# Patient Record
Sex: Male | Born: 1935 | Race: White | Hispanic: No | Marital: Married | Smoking: Former smoker
Health system: Southern US, Community
[De-identification: ages and names within clinical notes are randomized; demographics above are authoritative.]

## PROBLEM LIST (undated history)

## (undated) DIAGNOSIS — M79602 Pain in left arm: Secondary | ICD-10-CM

## (undated) DIAGNOSIS — K449 Diaphragmatic hernia without obstruction or gangrene: Secondary | ICD-10-CM

## (undated) DIAGNOSIS — E079 Disorder of thyroid, unspecified: Secondary | ICD-10-CM

## (undated) DIAGNOSIS — N400 Enlarged prostate without lower urinary tract symptoms: Secondary | ICD-10-CM

## (undated) DIAGNOSIS — I10 Essential (primary) hypertension: Secondary | ICD-10-CM

## (undated) DIAGNOSIS — E039 Hypothyroidism, unspecified: Secondary | ICD-10-CM

## (undated) DIAGNOSIS — K225 Diverticulum of esophagus, acquired: Secondary | ICD-10-CM

## (undated) DIAGNOSIS — E119 Type 2 diabetes mellitus without complications: Secondary | ICD-10-CM

## (undated) DIAGNOSIS — N200 Calculus of kidney: Secondary | ICD-10-CM

## (undated) HISTORY — DX: Benign prostatic hyperplasia without lower urinary tract symptoms: N40.0

## (undated) HISTORY — PX: PROSTATE SURGERY: SHX751

## (undated) HISTORY — PX: OTHER SURGICAL HISTORY: SHX169

## (undated) HISTORY — PX: THROAT SURGERY: SHX803

## (undated) HISTORY — DX: Diaphragmatic hernia without obstruction or gangrene: K44.9

## (undated) HISTORY — DX: Diverticulum of esophagus, acquired: K22.5

## (undated) HISTORY — PX: RETINAL DETACHMENT SURGERY: SHX105

## (undated) HISTORY — DX: Pain in left arm: M79.602

## (undated) HISTORY — DX: Calculus of kidney: N20.0

---

## 1997-12-24 ENCOUNTER — Ambulatory Visit: Admission: RE | Admit: 1997-12-24 | Discharge: 1997-12-24 | Payer: Self-pay | Admitting: Internal Medicine

## 2000-05-18 ENCOUNTER — Encounter: Payer: Self-pay | Admitting: Internal Medicine

## 2000-05-18 ENCOUNTER — Encounter: Admission: RE | Admit: 2000-05-18 | Discharge: 2000-05-18 | Payer: Self-pay | Admitting: Internal Medicine

## 2004-05-27 ENCOUNTER — Ambulatory Visit (HOSPITAL_COMMUNITY): Admission: RE | Admit: 2004-05-27 | Discharge: 2004-05-27 | Payer: Self-pay | Admitting: Internal Medicine

## 2007-07-27 ENCOUNTER — Encounter: Admission: RE | Admit: 2007-07-27 | Discharge: 2007-07-27 | Payer: Self-pay | Admitting: Internal Medicine

## 2008-01-29 ENCOUNTER — Encounter: Admission: RE | Admit: 2008-01-29 | Discharge: 2008-01-29 | Payer: Self-pay | Admitting: Internal Medicine

## 2010-06-07 ENCOUNTER — Encounter: Payer: Self-pay | Admitting: Internal Medicine

## 2010-06-07 ENCOUNTER — Encounter: Payer: Self-pay | Admitting: Endocrinology

## 2011-09-22 DIAGNOSIS — I10 Essential (primary) hypertension: Secondary | ICD-10-CM | POA: Diagnosis not present

## 2011-09-22 DIAGNOSIS — E7800 Pure hypercholesterolemia, unspecified: Secondary | ICD-10-CM | POA: Diagnosis not present

## 2011-09-22 DIAGNOSIS — E039 Hypothyroidism, unspecified: Secondary | ICD-10-CM | POA: Diagnosis not present

## 2011-09-22 DIAGNOSIS — E119 Type 2 diabetes mellitus without complications: Secondary | ICD-10-CM | POA: Diagnosis not present

## 2011-09-27 DIAGNOSIS — K449 Diaphragmatic hernia without obstruction or gangrene: Secondary | ICD-10-CM | POA: Diagnosis not present

## 2011-09-27 DIAGNOSIS — R634 Abnormal weight loss: Secondary | ICD-10-CM | POA: Diagnosis not present

## 2011-10-25 DIAGNOSIS — I10 Essential (primary) hypertension: Secondary | ICD-10-CM | POA: Diagnosis not present

## 2011-10-25 DIAGNOSIS — E039 Hypothyroidism, unspecified: Secondary | ICD-10-CM | POA: Diagnosis not present

## 2011-10-25 DIAGNOSIS — E7800 Pure hypercholesterolemia, unspecified: Secondary | ICD-10-CM | POA: Diagnosis not present

## 2011-10-25 DIAGNOSIS — R7309 Other abnormal glucose: Secondary | ICD-10-CM | POA: Diagnosis not present

## 2012-03-20 DIAGNOSIS — D485 Neoplasm of uncertain behavior of skin: Secondary | ICD-10-CM | POA: Diagnosis not present

## 2012-03-20 DIAGNOSIS — C44529 Squamous cell carcinoma of skin of other part of trunk: Secondary | ICD-10-CM | POA: Diagnosis not present

## 2012-03-20 DIAGNOSIS — L821 Other seborrheic keratosis: Secondary | ICD-10-CM | POA: Diagnosis not present

## 2012-03-20 DIAGNOSIS — L570 Actinic keratosis: Secondary | ICD-10-CM | POA: Diagnosis not present

## 2012-03-20 DIAGNOSIS — D239 Other benign neoplasm of skin, unspecified: Secondary | ICD-10-CM | POA: Diagnosis not present

## 2012-03-20 DIAGNOSIS — C44111 Basal cell carcinoma of skin of unspecified eyelid, including canthus: Secondary | ICD-10-CM | POA: Diagnosis not present

## 2012-04-01 DIAGNOSIS — Z23 Encounter for immunization: Secondary | ICD-10-CM | POA: Diagnosis not present

## 2012-04-25 DIAGNOSIS — I10 Essential (primary) hypertension: Secondary | ICD-10-CM | POA: Diagnosis not present

## 2012-04-25 DIAGNOSIS — E039 Hypothyroidism, unspecified: Secondary | ICD-10-CM | POA: Diagnosis not present

## 2012-04-25 DIAGNOSIS — R7309 Other abnormal glucose: Secondary | ICD-10-CM | POA: Diagnosis not present

## 2012-05-01 DIAGNOSIS — I10 Essential (primary) hypertension: Secondary | ICD-10-CM | POA: Diagnosis not present

## 2012-05-01 DIAGNOSIS — R7309 Other abnormal glucose: Secondary | ICD-10-CM | POA: Diagnosis not present

## 2012-05-01 DIAGNOSIS — Z23 Encounter for immunization: Secondary | ICD-10-CM | POA: Diagnosis not present

## 2012-05-01 DIAGNOSIS — E039 Hypothyroidism, unspecified: Secondary | ICD-10-CM | POA: Diagnosis not present

## 2012-05-01 DIAGNOSIS — E7800 Pure hypercholesterolemia, unspecified: Secondary | ICD-10-CM | POA: Diagnosis not present

## 2012-05-29 DIAGNOSIS — L570 Actinic keratosis: Secondary | ICD-10-CM | POA: Diagnosis not present

## 2012-05-29 DIAGNOSIS — E039 Hypothyroidism, unspecified: Secondary | ICD-10-CM | POA: Diagnosis not present

## 2012-05-29 DIAGNOSIS — I10 Essential (primary) hypertension: Secondary | ICD-10-CM | POA: Diagnosis not present

## 2012-10-25 DIAGNOSIS — E039 Hypothyroidism, unspecified: Secondary | ICD-10-CM | POA: Diagnosis not present

## 2012-10-25 DIAGNOSIS — I10 Essential (primary) hypertension: Secondary | ICD-10-CM | POA: Diagnosis not present

## 2012-10-25 DIAGNOSIS — R7309 Other abnormal glucose: Secondary | ICD-10-CM | POA: Diagnosis not present

## 2012-10-30 DIAGNOSIS — E039 Hypothyroidism, unspecified: Secondary | ICD-10-CM | POA: Diagnosis not present

## 2012-10-30 DIAGNOSIS — R351 Nocturia: Secondary | ICD-10-CM | POA: Diagnosis not present

## 2012-10-30 DIAGNOSIS — I10 Essential (primary) hypertension: Secondary | ICD-10-CM | POA: Diagnosis not present

## 2012-10-30 DIAGNOSIS — R7309 Other abnormal glucose: Secondary | ICD-10-CM | POA: Diagnosis not present

## 2013-01-24 DIAGNOSIS — R351 Nocturia: Secondary | ICD-10-CM | POA: Diagnosis not present

## 2013-01-29 DIAGNOSIS — R7309 Other abnormal glucose: Secondary | ICD-10-CM | POA: Diagnosis not present

## 2013-01-29 DIAGNOSIS — N401 Enlarged prostate with lower urinary tract symptoms: Secondary | ICD-10-CM | POA: Diagnosis not present

## 2013-01-29 DIAGNOSIS — I10 Essential (primary) hypertension: Secondary | ICD-10-CM | POA: Diagnosis not present

## 2013-01-29 DIAGNOSIS — E039 Hypothyroidism, unspecified: Secondary | ICD-10-CM | POA: Diagnosis not present

## 2013-02-16 DIAGNOSIS — R7309 Other abnormal glucose: Secondary | ICD-10-CM | POA: Diagnosis not present

## 2013-04-23 ENCOUNTER — Encounter (HOSPITAL_COMMUNITY): Payer: Self-pay | Admitting: Emergency Medicine

## 2013-04-23 ENCOUNTER — Observation Stay (HOSPITAL_COMMUNITY)
Admission: EM | Admit: 2013-04-23 | Discharge: 2013-04-23 | Disposition: A | Discharge status: Home / Self Care | Payer: Medicare Other | Attending: Internal Medicine | Admitting: Internal Medicine

## 2013-04-23 ENCOUNTER — Emergency Department (HOSPITAL_COMMUNITY): Payer: Medicare Other

## 2013-04-23 ENCOUNTER — Inpatient Hospital Stay (HOSPITAL_COMMUNITY): Payer: Medicare Other

## 2013-04-23 DIAGNOSIS — E119 Type 2 diabetes mellitus without complications: Secondary | ICD-10-CM | POA: Diagnosis not present

## 2013-04-23 DIAGNOSIS — E039 Hypothyroidism, unspecified: Secondary | ICD-10-CM | POA: Diagnosis not present

## 2013-04-23 DIAGNOSIS — Z79899 Other long term (current) drug therapy: Secondary | ICD-10-CM | POA: Insufficient documentation

## 2013-04-23 DIAGNOSIS — E079 Disorder of thyroid, unspecified: Secondary | ICD-10-CM | POA: Insufficient documentation

## 2013-04-23 DIAGNOSIS — R4789 Other speech disturbances: Secondary | ICD-10-CM | POA: Insufficient documentation

## 2013-04-23 DIAGNOSIS — G319 Degenerative disease of nervous system, unspecified: Secondary | ICD-10-CM | POA: Diagnosis not present

## 2013-04-23 DIAGNOSIS — Z8673 Personal history of transient ischemic attack (TIA), and cerebral infarction without residual deficits: Secondary | ICD-10-CM | POA: Diagnosis not present

## 2013-04-23 DIAGNOSIS — I10 Essential (primary) hypertension: Secondary | ICD-10-CM | POA: Diagnosis present

## 2013-04-23 DIAGNOSIS — G459 Transient cerebral ischemic attack, unspecified: Secondary | ICD-10-CM | POA: Diagnosis present

## 2013-04-23 DIAGNOSIS — I079 Rheumatic tricuspid valve disease, unspecified: Secondary | ICD-10-CM | POA: Diagnosis not present

## 2013-04-23 DIAGNOSIS — I359 Nonrheumatic aortic valve disorder, unspecified: Secondary | ICD-10-CM | POA: Insufficient documentation

## 2013-04-23 DIAGNOSIS — I059 Rheumatic mitral valve disease, unspecified: Secondary | ICD-10-CM | POA: Diagnosis not present

## 2013-04-23 DIAGNOSIS — I6789 Other cerebrovascular disease: Secondary | ICD-10-CM | POA: Diagnosis not present

## 2013-04-23 DIAGNOSIS — Z9889 Other specified postprocedural states: Secondary | ICD-10-CM | POA: Diagnosis not present

## 2013-04-23 DIAGNOSIS — R4701 Aphasia: Secondary | ICD-10-CM | POA: Diagnosis not present

## 2013-04-23 DIAGNOSIS — E785 Hyperlipidemia, unspecified: Secondary | ICD-10-CM | POA: Diagnosis not present

## 2013-04-23 DIAGNOSIS — I6529 Occlusion and stenosis of unspecified carotid artery: Secondary | ICD-10-CM | POA: Insufficient documentation

## 2013-04-23 DIAGNOSIS — I6350 Cerebral infarction due to unspecified occlusion or stenosis of unspecified cerebral artery: Secondary | ICD-10-CM | POA: Diagnosis not present

## 2013-04-23 HISTORY — DX: Hypothyroidism, unspecified: E03.9

## 2013-04-23 HISTORY — DX: Disorder of thyroid, unspecified: E07.9

## 2013-04-23 HISTORY — DX: Essential (primary) hypertension: I10

## 2013-04-23 HISTORY — DX: Type 2 diabetes mellitus without complications: E11.9

## 2013-04-23 LAB — CBC
HCT: 39.5 % (ref 39.0–52.0)
Hemoglobin: 13.6 g/dL (ref 13.0–17.0)
MCH: 31.8 pg (ref 26.0–34.0)
MCHC: 34.4 g/dL (ref 30.0–36.0)
MCV: 92.3 fL (ref 78.0–100.0)

## 2013-04-23 LAB — URINALYSIS, ROUTINE W REFLEX MICROSCOPIC
Glucose, UA: NEGATIVE mg/dL
Hgb urine dipstick: NEGATIVE
Leukocytes, UA: NEGATIVE
Nitrite: NEGATIVE
Protein, ur: NEGATIVE mg/dL

## 2013-04-23 LAB — COMPREHENSIVE METABOLIC PANEL
ALT: 12 U/L (ref 0–53)
AST: 14 U/L (ref 0–37)
Albumin: 3.4 g/dL — ABNORMAL LOW (ref 3.5–5.2)
BUN: 18 mg/dL (ref 6–23)
CO2: 28 mEq/L (ref 19–32)
Calcium: 9.2 mg/dL (ref 8.4–10.5)
Sodium: 136 mEq/L (ref 135–145)
Total Bilirubin: 0.2 mg/dL — ABNORMAL LOW (ref 0.3–1.2)
Total Protein: 6.5 g/dL (ref 6.0–8.3)

## 2013-04-23 LAB — LIPID PANEL
Cholesterol: 204 mg/dL — ABNORMAL HIGH (ref 0–200)
Total CHOL/HDL Ratio: 2.7 RATIO
Triglycerides: 49 mg/dL (ref <150)

## 2013-04-23 LAB — POCT I-STAT TROPONIN I

## 2013-04-23 LAB — HEMOGLOBIN A1C: Hgb A1c MFr Bld: 6.3 % — ABNORMAL HIGH (ref <5.7)

## 2013-04-23 LAB — GLUCOSE, CAPILLARY
Glucose-Capillary: 111 mg/dL — ABNORMAL HIGH (ref 70–99)
Glucose-Capillary: 101 mg/dL — ABNORMAL HIGH (ref 70–99)

## 2013-04-23 LAB — PROTIME-INR
INR: 0.89 (ref 0.00–1.49)
Prothrombin Time: 11.9 seconds (ref 11.6–15.2)

## 2013-04-23 LAB — TSH: TSH: 7.559 u[IU]/mL — ABNORMAL HIGH (ref 0.350–4.500)

## 2013-04-23 MED ORDER — AMLODIPINE BESYLATE 5 MG PO TABS
5.0000 mg | ORAL_TABLET | Freq: Every day | ORAL | Status: DC
  Filled 2013-04-23: qty 1

## 2013-04-23 MED ORDER — TERAZOSIN HCL 2 MG PO CAPS
2.0000 mg | ORAL_CAPSULE | Freq: Every evening | ORAL | Status: DC
  Filled 2013-04-23: qty 1

## 2013-04-23 MED ORDER — ENOXAPARIN SODIUM 40 MG/0.4ML ~~LOC~~ SOLN
40.0000 mg | SUBCUTANEOUS | Status: DC
  Administered 2013-04-23: 40 mg via SUBCUTANEOUS
  Filled 2013-04-23 (×2): qty 0.4

## 2013-04-23 MED ORDER — ASPIRIN 325 MG PO TABS
325.0000 mg | ORAL_TABLET | Freq: Once | ORAL | Status: AC
  Administered 2013-04-23: 325 mg via ORAL
  Filled 2013-04-23: qty 1

## 2013-04-23 MED ORDER — ASPIRIN 325 MG PO TABS
325.0000 mg | ORAL_TABLET | Freq: Every day | ORAL | Status: DC
  Filled 2013-04-23: qty 1

## 2013-04-23 MED ORDER — AMLODIPINE BESYLATE 5 MG PO TABS: 5.0000 mg | ORAL_TABLET | Freq: Every day | ORAL | Status: DC

## 2013-04-23 MED ORDER — HYDRALAZINE HCL 20 MG/ML IJ SOLN: 10.0000 mg | INTRAMUSCULAR | Status: DC | PRN

## 2013-04-23 MED ORDER — SODIUM CHLORIDE 0.9 % IV SOLN: INTRAVENOUS | Status: DC

## 2013-04-23 MED ORDER — VITAMIN B-12 1000 MCG PO TABS
1000.0000 ug | ORAL_TABLET | Freq: Every evening | ORAL | Status: DC
Start: 2013-04-23 — End: 2013-04-23
  Filled 2013-04-23: qty 1

## 2013-04-23 MED ORDER — HYDRALAZINE HCL 20 MG/ML IJ SOLN: 10.0000 mg | Freq: Four times a day (QID) | INTRAMUSCULAR | Status: DC | PRN

## 2013-04-23 MED ORDER — INSULIN ASPART 100 UNIT/ML ~~LOC~~ SOLN: 0.0000 [IU] | Freq: Three times a day (TID) | SUBCUTANEOUS | Status: DC

## 2013-04-23 MED ORDER — LEVOTHYROXINE SODIUM 25 MCG PO TABS
25.0000 ug | ORAL_TABLET | Freq: Every evening | ORAL | Status: DC
Start: 2013-04-23 — End: 2013-04-23
  Filled 2013-04-23: qty 1

## 2013-04-23 MED ORDER — ATORVASTATIN CALCIUM 20 MG PO TABS: 20.0000 mg | ORAL_TABLET | Freq: Every day | ORAL | Status: DC

## 2013-04-23 MED ORDER — LISINOPRIL 20 MG PO TABS
20.0000 mg | ORAL_TABLET | Freq: Every day | ORAL | Status: DC
  Administered 2013-04-23: 20 mg via ORAL
  Filled 2013-04-23: qty 1

## 2013-04-23 MED ORDER — ATORVASTATIN CALCIUM 20 MG PO TABS
20.0000 mg | ORAL_TABLET | Freq: Every day | ORAL | Status: DC
Start: 2013-04-23 — End: 2013-04-23
  Filled 2013-04-23: qty 1

## 2013-04-23 MED ORDER — ASPIRIN EC 81 MG PO TBEC: 81.0000 mg | DELAYED_RELEASE_TABLET | Freq: Every day | ORAL | Status: AC

## 2013-04-23 NOTE — Discharge Summary (Signed)
Physician Discharge Summary  Patient ID: VOLLIE BRUNTY MRN: [id number] DOB/AGE: [birth year] 79 y.o.  Admit date: 04/23/2013 Discharge date: 04/23/2013  Primary Care Physician:  [name], MD  Discharge Diagnoses:    . TIA (transient ischemic attack) . HTN (hypertension)- uncontrolled  . Hypothyroidism . Diabetes  hyperlipidemia   Consults: Neurology, Dr. Cyril Mourning via phone consultation  Recommendations for Outpatient Follow-up:  1. please check CK and LFTs in 4 weeks, patient started on Lipitor 2. patient was also started on Norvasc 5 mg daily, please followup in 10 days, patient may need to titrate BP medications  Allergies:  No Known Allergies   Discharge Medications:   Medication List         amLODipine 5 MG tablet  Commonly known as:  NORVASC  Take 1 tablet (5 mg total) by mouth daily.     aspirin EC 81 MG tablet  Take 1 tablet (81 mg total) by mouth daily.     atorvastatin 20 MG tablet  Commonly known as:  LIPITOR  Take 1 tablet (20 mg total) by mouth at bedtime.     CINNAMON PO  Take 1,000 mg by mouth every evening.     levothyroxine 25 MCG tablet  Commonly known as:  SYNTHROID, LEVOTHROID  Take 25 mcg by mouth every evening.     lisinopril-hydrochlorothiazide 20-12.5 MG per tablet  Commonly known as:  PRINZIDE,ZESTORETIC  Take 1 tablet by mouth every evening.     metFORMIN 500 MG tablet  Commonly known as:  GLUCOPHAGE  Take 250 mg by mouth every evening.     terazosin 2 MG capsule  Commonly known as:  HYTRIN  Take 2 mg by mouth every evening.     vitamin B-12 1000 MCG tablet  Commonly known as:  CYANOCOBALAMIN  Take 1,000 mcg by mouth every evening.         Brief H and P: For complete details please refer to admission H and P, but in brief ALLYN BERTONI is a 79 y.o. male with history of hypertension, hypothyroidism, prediabetes started experiencing slurred speech a day before around 2:30 PM when he was not able to bring words out of his  mouth which lasted for around 2 minutes. Patient did not have any associated difficulty swallowing or any blurred vision. Patient did not lose strength of his upper or lower extremities. Since he was worried he came in the middle of the night with concerns of his symptoms. In the ER patient was nonfocal on exam. CT head did not show anything acute. Patient was admitted for further workup for TIA. Patient denies any chest pain shortness of breath nausea vomiting abdominal pain fever chills diarrhea.    Hospital Course:  79 year old male with history of hypertension, diabetes, hypothyroidism presented with transient aphasia  TIA: Symptoms were completely resolved at the time of admission. Patient underwent full stroke workup. MRI of the brain showed no evidence of acute infarction, hemorrhage, mass lesion, hydrocephalus. MRA showed mild to moderate atrophy and chronic microvascular ischemic changes, no acute intracranial findings. 2-D echo showed EF of 55-60%, grade 1 diastolic dysfunction Carotid Dopplers showed 1-39% ICA stenosis. Patient was not on any aspirin prior to admission, he was initially placed on aspirin 325mg  daily. I discussed in detail with Dr. Cyril Mourning for neurology consult, since MRI was negative for acute stroke, Dr. Cyril Mourning recommended aspirin 81 mg daily. Lipid panel showed cholesterol 214 LDL of under 19, patient was started on Lipitor 20 mg daily Hemoglobin A1c  is 6.3. Patient is on metformin 250 mg daily, he will discuss with Dr. Selena Batten if the dose needs to be increased. Patient is currently at his baseline. He is ambulating without any assistance, eating without any difficulty.  Patient will be discharged in good condition.  Day of Discharge BP 176/83  Pulse 54  Temp(Src) 97.7 F (36.5 C) (Oral)  Resp 18  Ht 5\' 6"  (1.676 m)  Wt 62.8 kg (138 lb 7.2 oz)  BMI 22.36 kg/m2  SpO2 95%  Physical Exam: General: Alert and awake oriented x3 not in any acute distress. HEENT:  anicteric sclera, pupils reactive to light and accommodation CVS: S1-S2 clear no murmur rubs or gallops Chest: clear to auscultation bilaterally, no wheezing rales or rhonchi Abdomen: soft nontender, nondistended, normal bowel sounds, no organomegaly Extremities: no cyanosis, clubbing or edema noted bilaterally Neuro: Cranial nerves II-XII intact, no focal neurological deficits   The results of significant diagnostics from this hospitalization (including imaging, microbiology, ancillary and laboratory) are listed below for reference.    LAB RESULTS: Basic Metabolic Panel:  Recent Labs Lab 04/23/13 0445  NA 136  K 3.9  CL 101  CO2 28  GLUCOSE 124*  BUN 18  CREATININE 1.06  CALCIUM 9.2   Liver Function Tests:  Recent Labs Lab 04/23/13 0445  AST 14  ALT 12  ALKPHOS 80  BILITOT 0.2*  PROT 6.5  ALBUMIN 3.4*   No results found for this basename: LIPASE, AMYLASE,  in the last 168 hours No results found for this basename: AMMONIA,  in the last 168 hours CBC:  Recent Labs Lab 04/23/13 0445  WBC 6.5  HGB 13.6  HCT 39.5  MCV 92.3  PLT 241   Cardiac Enzymes: No results found for this basename: CKTOTAL, CKMB, CKMBINDEX, TROPONINI,  in the last 168 hours BNP: No components found with this basename: POCBNP,  CBG:  Recent Labs Lab 04/23/13 0817 04/23/13 1225  GLUCAP 111* 101*    Significant Diagnostic Studies:  Ct Head Wo Contrast  04/23/2013   CLINICAL DATA:  Transient difficulty speaking.  EXAM: CT HEAD WITHOUT CONTRAST  TECHNIQUE: Contiguous axial images were obtained from the base of the skull through the vertex without intravenous contrast.  COMPARISON:  None.  FINDINGS: There is no evidence of acute infarction, mass lesion, or intra- or extra-axial hemorrhage on CT.  Prominence of the ventricles and sulci reflects mild to moderate cortical volume loss. Cerebellar atrophy is noted. Scattered periventricular and subcortical white matter change likely reflects  small vessel ischemic microangiopathy. Scattered small chronic lacunar infarcts are seen within the left basal ganglia.  The brainstem and fourth ventricle are within normal limits. The cerebral hemispheres demonstrate grossly normal gray-white differentiation. No mass effect or midline shift is seen.  There is no evidence of fracture; visualized osseous structures are unremarkable in appearance. The patient is status post right-sided scleral banding. The orbits are otherwise unremarkable. The paranasal sinuses and mastoid air cells are well-aerated. No significant soft tissue abnormalities are seen.  IMPRESSION: 1. No acute intracranial pathology seen on CT. 2. Mild to moderate cortical volume loss and scattered small vessel ischemic microangiopathy. 3. Scattered small chronic lacunar infarcts within the left basal ganglia.   Electronically Signed   By: Roanna Raider M.D.   On: 04/23/2013 05:41   Mri Brain Without Contrast  04/23/2013   CLINICAL DATA:  Slurred speech. Stroke risk factors include hypertension and diabetes. Symptoms have now resolved.  EXAM: MRI HEAD WITHOUT CONTRAST  MRA  HEAD WITHOUT CONTRAST  TECHNIQUE: Multiplanar, multiecho pulse sequences of the brain and surrounding structures were obtained without intravenous contrast. Angiographic images of the head were obtained using MRA technique without contrast.  COMPARISON:  CT head 04/23/2013.  FINDINGS: MRI HEAD FINDINGS  No evidence for acute infarction, hemorrhage, mass lesion, hydrocephalus, or extra-axial fluid. Mild to moderate cerebral and cerebellar atrophy. Mild to moderate subcortical and periventricular T2 and FLAIR hyperintensities, likely chronic microvascular ischemic change. Flow voids are maintained throughout the carotid, basilar, and vertebral arteries. There are no areas of chronic hemorrhage. Pituitary, pineal, and cerebellar tonsils unremarkable.  Moderate pannus surrounds the odontoid and projects into the ventral subarachnoid  space. The upper cervical cord is minimally displaced posteriorly without significant compression. Visualized calvarium, skull base, and upper cervical osseous structures otherwise unremarkable.  Scalp and extracranial soft tissues, orbits, sinuses, and mastoids show no acute process. Previous cataract extraction. Compared with prior CT the appearance is similar.  MRA HEAD FINDINGS  The internal carotid arteries are widely patent. The basilar artery is widely patent with vertebrals codominant. There is no proximal intracranial stenosis or aneurysm. There is slight irregularity of the distal MCA and PCA branches suggesting intracranial atherosclerotic change.  IMPRESSION: Mild to moderate atrophy and chronic microvascular ischemic change. No acute intracranial findings.  Moderate pannus surrounds the odontoid. Correlate clinically for erosive arthropathy elsewhere. See discussion above.  No proximal flow reducing stenosis or occlusion. Mild irregularity of distal MCA and PCA branches suggests intracranial atherosclerotic change.   Electronically Signed   By: Davonna Belling M.D.   On: 04/23/2013 14:11   Mr Maxine Glenn Head/brain Wo Cm  04/23/2013   CLINICAL DATA:  Slurred speech. Stroke risk factors include hypertension and diabetes. Symptoms have now resolved.  EXAM: MRI HEAD WITHOUT CONTRAST  MRA HEAD WITHOUT CONTRAST  TECHNIQUE: Multiplanar, multiecho pulse sequences of the brain and surrounding structures were obtained without intravenous contrast. Angiographic images of the head were obtained using MRA technique without contrast.  COMPARISON:  CT head 04/23/2013.  FINDINGS: MRI HEAD FINDINGS  No evidence for acute infarction, hemorrhage, mass lesion, hydrocephalus, or extra-axial fluid. Mild to moderate cerebral and cerebellar atrophy. Mild to moderate subcortical and periventricular T2 and FLAIR hyperintensities, likely chronic microvascular ischemic change. Flow voids are maintained throughout the carotid, basilar,  and vertebral arteries. There are no areas of chronic hemorrhage. Pituitary, pineal, and cerebellar tonsils unremarkable.  Moderate pannus surrounds the odontoid and projects into the ventral subarachnoid space. The upper cervical cord is minimally displaced posteriorly without significant compression. Visualized calvarium, skull base, and upper cervical osseous structures otherwise unremarkable.  Scalp and extracranial soft tissues, orbits, sinuses, and mastoids show no acute process. Previous cataract extraction. Compared with prior CT the appearance is similar.  MRA HEAD FINDINGS  The internal carotid arteries are widely patent. The basilar artery is widely patent with vertebrals codominant. There is no proximal intracranial stenosis or aneurysm. There is slight irregularity of the distal MCA and PCA branches suggesting intracranial atherosclerotic change.  IMPRESSION: Mild to moderate atrophy and chronic microvascular ischemic change. No acute intracranial findings.  Moderate pannus surrounds the odontoid. Correlate clinically for erosive arthropathy elsewhere. See discussion above.  No proximal flow reducing stenosis or occlusion. Mild irregularity of distal MCA and PCA branches suggests intracranial atherosclerotic change.   Electronically Signed   By: Davonna Belling M.D.   On: 04/23/2013 14:11    2D ECHO:  Study Conclusions  - Left ventricle: The cavity size was normal. Wall thickness  was normal. Systolic function was normal. The estimated ejection fraction was in the range of 55% to 60%. Wall motion was normal; there were no regional wall motion abnormalities. Doppler parameters are consistent with abnormal left ventricular relaxation (grade 1 diastolic dysfunction). - Aortic valve: There was no stenosis. Trivial regurgitation. - Aorta: Dilated aortic root and ascending aorta. Aortic root dimension: 41mm (ED), ascending aorta dimension: 44 mm. - Mitral valve: Mildly calcified annulus. Normal  thickness leaflets . Trivial regurgitation. - Left atrium: The atrium was mildly dilated. - Right ventricle: The cavity size was normal. Systolic function was normal. - Right atrium: The atrium was mildly dilated. - Pulmonary arteries: PA systolic pressure 31-35 mmHg. - Systemic veins: IVC measured 2.0 cm with normal respirophasic variation, suggesting RA pressure 6-10 mmHg.   Disposition and Follow-up:     Discharge Orders   Future Orders Complete By Expires   Diet - low sodium heart healthy  As directed    Increase activity slowly  As directed        DISPOSITION: Home  DIET:Heart healthy diet  ACTIVITY: As tolerated    DISCHARGE FOLLOW-UP Follow-up Information   Follow up with [name], MD. Schedule an appointment as soon as possible for a visit in 10 days. (for hospital follow-up)    Specialty:  Internal Medicine   Contact information:   [address] [place] [address] [phone]       Follow up with Gates Rigg, MD. Schedule an appointment as soon as possible for a visit in 2 months. (for TIA follow-up)    Specialties:  Neurology, Radiology   Contact information:   [address] [place] [address] [phone]       Time spent on Discharge: 35 minutes  Signed:   RAI,RIPUDEEP M.D. [facility] 04/23/2013, 2:56 PM Pager: [phone]

## 2013-04-23 NOTE — Progress Notes (Signed)
VASCULAR LAB PRELIMINARY  PRELIMINARY  PRELIMINARY  PRELIMINARY  Carotid duplex completed.    Preliminary report:  Bilateral:  1-39% ICA stenosis.  Vertebral artery flow is antegrade.     Jozelyn Kuwahara, RVS 04/23/2013, 12:53 PM

## 2013-04-23 NOTE — Progress Notes (Signed)
patient seen and examined  Review 79 year old male with history of hypertension, diabetes, hypothyroidism presented with transient aphasia - symptoms resolved, examination unremarkable  A/p - TIA workup in progress - MRI/MRA still pending - 2-D echo done, results pending - Carotid Dopplers 1-39% ICA stenosis - Continue aspirin. Discussed with Dr. Cyril Mourning recommended aspirin 81 mg daily if no infarct on the MRI, otherwise aspirin 325 mg daily. Patient was not on aspirin prior to the admission - Lipid panel pending - Continue BP control   Nylia Gavina M.D. [facility] 04/23/2013, 1:27 PM  Pager: [phone]

## 2013-04-23 NOTE — ED Notes (Signed)
Patient states at approx 1500 yesterday he began having trouble speaking which last approx 5 min. With symptoms then resolving.  Patient states no symptoms since that time, patient a/o x 4 at this time and asymptomatic

## 2013-04-23 NOTE — Progress Notes (Signed)
Paged Dr. Isidoro Donning concerning systolic >180.    Lance Bosch, RN

## 2013-04-23 NOTE — H&P (Signed)
[facility] History and Physical  Austin Swanson HYQ:[id number] DOB: 1917-06-27 DOA: 04/23/2013  Referring physician: ER physician. PCP: No primary provider on file.  Chief Complaint: Slurred speech.  HPI: Austin Swanson is a 79 y.o. male with history of hypertension, hypothyroidism, prediabetes started experiencing slurred speech yesterday around 2:30 PM when he was not able to bring words out of his mouth which lasted for around 2 minutes. Patient did not have any associated difficulty swallowing or any blurred vision. Patient did not lose strengths of his upper or lower extremities. Since he was worried he came in the middle of the night with concerns of his symptoms. In the ER patient was nonfocal on exam. CT head did not show anything acute. Patient has been admitted for further workup for TIA. Patient denies any chest pain shortness of breath nausea vomiting abdominal pain fever chills diarrhea. Patient states 2 years ago he had surgery for his throat for diverticulum.   Review of Systems: As presented in the history of presenting illness, rest negative.  Past Medical History  Diagnosis Date  . Hypertension   . Diabetes mellitus without complication   . Thyroid disease    Past Surgical History  Procedure Laterality Date  . Prostate surgery    . Throat surgery     Social History:  reports that he has never smoked. He does not have any smokeless tobacco history on file. He reports that he does not drink alcohol or use illicit drugs. Where does patient live home. Can patient participate in ADLs? Yes.  No Known Allergies  Family History:  Family History  Problem Relation Age of Onset  . CAD Mother       Prior to Admission medications   Medication Sig Start Date End Date Taking? Authorizing Provider  CINNAMON PO Take 1,000 mg by mouth every evening.   Yes Historical Provider, MD  levothyroxine (SYNTHROID, LEVOTHROID) 25 MCG tablet Take 25 mcg by mouth every evening.    Yes Historical Provider, MD  lisinopril-hydrochlorothiazide (PRINZIDE,ZESTORETIC) 20-12.5 MG per tablet Take 1 tablet by mouth every evening.   Yes Historical Provider, MD  metFORMIN (GLUCOPHAGE) 500 MG tablet Take 250 mg by mouth every evening.   Yes Historical Provider, MD  terazosin (HYTRIN) 2 MG capsule Take 2 mg by mouth every evening.   Yes Historical Provider, MD  vitamin B-12 (CYANOCOBALAMIN) 1000 MCG tablet Take 1,000 mcg by mouth every evening.   Yes Historical Provider, MD    Physical Exam: Filed Vitals:   04/23/13 0502 04/23/13 0503 04/23/13 0504 04/23/13 0600  BP:    178/78  Pulse:      Temp: 98 F (36.7 C) 98 F (36.7 C) 98 F (36.7 C)   TempSrc:      Resp:    18  Weight:      SpO2:    94%     General:  Well-developed and nourished.  Eyes: Anicteric no pallor.  ENT: No discharge from the ears eyes nose mouth.  Neck: No mass felt.  Cardiovascular: S1-S2 heard.  Respiratory: No rhonchi or crepitations.  Abdomen: Soft nontender bowel sounds present. No guarding no rigidity.  Skin: No rash.  Musculoskeletal: No edema.  Psychiatric: Appears normal.  Neurologic: Alert awake oriented to time place and person. Moves all extremities 5 x 5. No facial asymmetry. Tongue is midline.  Labs on Admission:  Basic Metabolic Panel:  Recent Labs Lab 04/23/13 0445  NA 136  K 3.9  CL 101  CO2  28  GLUCOSE 124*  BUN 18  CREATININE 1.06  CALCIUM 9.2   Liver Function Tests:  Recent Labs Lab 04/23/13 0445  AST 14  ALT 12  ALKPHOS 80  BILITOT 0.2*  PROT 6.5  ALBUMIN 3.4*   No results found for this basename: LIPASE, AMYLASE,  in the last 168 hours No results found for this basename: AMMONIA,  in the last 168 hours CBC:  Recent Labs Lab 04/23/13 0445  WBC 6.5  HGB 13.6  HCT 39.5  MCV 92.3  PLT 241   Cardiac Enzymes: No results found for this basename: CKTOTAL, CKMB, CKMBINDEX, TROPONINI,  in the last 168 hours  BNP (last 3 results) No results  found for this basename: PROBNP,  in the last 8760 hours CBG: No results found for this basename: GLUCAP,  in the last 168 hours  Radiological Exams on Admission: Ct Head Wo Contrast  04/23/2013   CLINICAL DATA:  Transient difficulty speaking.  EXAM: CT HEAD WITHOUT CONTRAST  TECHNIQUE: Contiguous axial images were obtained from the base of the skull through the vertex without intravenous contrast.  COMPARISON:  None.  FINDINGS: There is no evidence of acute infarction, mass lesion, or intra- or extra-axial hemorrhage on CT.  Prominence of the ventricles and sulci reflects mild to moderate cortical volume loss. Cerebellar atrophy is noted. Scattered periventricular and subcortical white matter change likely reflects small vessel ischemic microangiopathy. Scattered small chronic lacunar infarcts are seen within the left basal ganglia.  The brainstem and fourth ventricle are within normal limits. The cerebral hemispheres demonstrate grossly normal gray-white differentiation. No mass effect or midline shift is seen.  There is no evidence of fracture; visualized osseous structures are unremarkable in appearance. The patient is status post right-sided scleral banding. The orbits are otherwise unremarkable. The paranasal sinuses and mastoid air cells are well-aerated. No significant soft tissue abnormalities are seen.  IMPRESSION: 1. No acute intracranial pathology seen on CT. 2. Mild to moderate cortical volume loss and scattered small vessel ischemic microangiopathy. 3. Scattered small chronic lacunar infarcts within the left basal ganglia.   Electronically Signed   By: Roanna Raider M.D.   On: 04/23/2013 05:41     Assessment/Plan Principal Problem:   TIA (transient ischemic attack) Active Problems:   HTN (hypertension)   Hypothyroidism   1. TIA - patient's symptoms are concerning for TIA. Patient has presented to the ER more than 12 hours later. Presently patient is asymptomatic. Patient has been  placed on neurochecks swallow evaluation and we will get MRI/MRA brain 2-D echo carotid Doppler. Aspirin. 2. Hypertension - continue lisinopril. We will hold off HCTZ and gently hydrate. When necessary IV hydralazine for systolic blood pressure more than 220. 3. Hypothyroidism - continue Synthroid. Check TSH. 4. History of prediabetes - presently I have placed patient on sliding-scale coverage. Check hemoglobin A1c.    Code Status: Full code.  Family Communication: Patient's wife at the bedside.  Disposition Plan: Admit for observation.    Davionne Mastrangelo N. [facility] Pager [phone].  If 7PM-7AM, please contact night-coverage [email or web address] Password [facility] 04/23/2013, 6:38 AM

## 2013-04-23 NOTE — ED Provider Notes (Signed)
CSN: [id number]     Arrival date & time 04/23/13  0316 History   First MD Initiated Contact with Patient 04/23/13 0413     Chief Complaint  Patient presents with  . Aphasia    resolved   (Consider location/radiation/quality/duration/timing/severity/associated sxs/prior Treatment) HPI 79 year old male presents to emergency room with complaint of brief period of aphasia.  Patient reports yesterday, around 3 PM, while having lunch with his daughter and wife.  He had a five-minute episode of having garbled speech.  Patient reports he knew what he wanted to say, but could not get the words out properly.  Wife the bedside, reports that the worse in garbled, and strange.  Patient reports over the last few weeks to months.  He has had several episodes similar to this.  Patient has history of hypertension, diabetes, and hypothyroidism.  No prior history of TIA or stroke.  Mother had several strokes.  Patient is concern for TIA, and could not sleep tonight hence coming to the emergency department.  Patient's primary care doctor is Austin Swanson. Past Medical History  Diagnosis Date  . Hypertension   . Diabetes mellitus without complication   . Thyroid disease    Past Surgical History  Procedure Laterality Date  . Prostate surgery    . Throat surgery     Family History  Problem Relation Age of Onset  . CAD Mother    History  Substance Use Topics  . Smoking status: Never Smoker   . Smokeless tobacco: Not on file  . Alcohol Use: No    Review of Systems  See History of Present Illness; otherwise all other systems are reviewed and negative Allergies  Review of patient's allergies indicates no known allergies.  Home Medications  No current outpatient prescriptions on file. BP 191/79  Pulse 60  Temp(Src) 97.6 F (36.4 C) (Oral)  Resp 15  Ht 5\' 6"  (1.676 m)  Wt 138 lb 7.2 oz (62.8 kg)  BMI 22.36 kg/m2  SpO2 94% Physical Exam  Nursing note and vitals reviewed. Constitutional: He is  oriented to person, place, and time. He appears well-developed and well-nourished. No distress.  HENT:  Head: Normocephalic and atraumatic.  Nose: Nose normal.  Mouth/Throat: Oropharynx is clear and moist.  Eyes: Conjunctivae and EOM are normal. Pupils are equal, round, and reactive to light.  Neck: Normal range of motion. Neck supple. No JVD present. No tracheal deviation present. No thyromegaly present.  Cardiovascular: Normal rate, regular rhythm, normal heart sounds and intact distal pulses.  Exam reveals no gallop and no friction rub.   No murmur heard. Pulmonary/Chest: Effort normal and breath sounds normal. No stridor. No respiratory distress. He has no wheezes. He has no rales. He exhibits no tenderness.  Abdominal: Soft. Bowel sounds are normal. He exhibits no distension and no mass. There is no tenderness. There is no rebound and no guarding.  Musculoskeletal: Normal range of motion. He exhibits no edema and no tenderness.  Lymphadenopathy:    He has no cervical adenopathy.  Neurological: He is alert and oriented to person, place, and time. He has normal reflexes. No cranial nerve deficit. He exhibits normal muscle tone. Coordination normal.  Skin: Skin is warm and dry. No rash noted. He is not diaphoretic. No erythema. No pallor.  Psychiatric: He has a normal mood and affect. His behavior is normal. Judgment and thought content normal.    ED Course  Procedures (including critical care time) Labs Review Labs Reviewed  COMPREHENSIVE METABOLIC PANEL -  Abnormal; Notable for the following:    Glucose, Bld 124 (*)    Albumin 3.4 (*)    Total Bilirubin 0.2 (*)    GFR calc non Af Amer 58 (*)    GFR calc Af Amer 68 (*)    All other components within normal limits  URINALYSIS, ROUTINE W REFLEX MICROSCOPIC  PROTIME-INR  CBC  HEMOGLOBIN A1C  CBC WITH DIFFERENTIAL  TSH  URINE RAPID DRUG SCREEN (HOSP PERFORMED)  CBC  POCT I-STAT TROPONIN I   Imaging Review Ct Head Wo  Contrast  04/23/2013   CLINICAL DATA:  Transient difficulty speaking.  EXAM: CT HEAD WITHOUT CONTRAST  TECHNIQUE: Contiguous axial images were obtained from the base of the skull through the vertex without intravenous contrast.  COMPARISON:  None.  FINDINGS: There is no evidence of acute infarction, mass lesion, or intra- or extra-axial hemorrhage on CT.  Prominence of the ventricles and sulci reflects mild to moderate cortical volume loss. Cerebellar atrophy is noted. Scattered periventricular and subcortical white matter change likely reflects small vessel ischemic microangiopathy. Scattered small chronic lacunar infarcts are seen within the left basal ganglia.  The brainstem and fourth ventricle are within normal limits. The cerebral hemispheres demonstrate grossly normal gray-white differentiation. No mass effect or midline shift is seen.  There is no evidence of fracture; visualized osseous structures are unremarkable in appearance. The patient is status post right-sided scleral banding. The orbits are otherwise unremarkable. The paranasal sinuses and mastoid air cells are well-aerated. No significant soft tissue abnormalities are seen.  IMPRESSION: 1. No acute intracranial pathology seen on CT. 2. Mild to moderate cortical volume loss and scattered small vessel ischemic microangiopathy. 3. Scattered small chronic lacunar infarcts within the left basal ganglia.   Electronically Signed   By: Roanna Raider M.D.   On: 04/23/2013 05:41    EKG Interpretation    Date/Time:    Ventricular Rate:    PR Interval:    QRS Duration:   QT Interval:    QTC Calculation:   R Axis:     Text Interpretation:              MDM   1. TIA (transient ischemic attack)   2. HTN (hypertension)   3. Hypothyroidism     80 year old male with TIA symptoms.  In discussion with him and his wife, patient is fairly active, plays golf 5-6 times a week, walks the course with his clubs, does not use a cart.  Patient  mentally sharp, no significant medical problems.  We'll start TIA.  Workup here in the emergency department, and discuss with hospitalist for admission for completion.  Will start patient on daily aspirin.  Olivia Mackie, MD 04/23/13 [phone]

## 2013-04-23 NOTE — Progress Notes (Signed)
*  PRELIMINARY RESULTS* Echocardiogram 2D Echocardiogram has been performed.  Austin Swanson 04/23/2013, 11:52 AM

## 2013-07-25 DIAGNOSIS — R7309 Other abnormal glucose: Secondary | ICD-10-CM | POA: Diagnosis not present

## 2013-07-25 DIAGNOSIS — I10 Essential (primary) hypertension: Secondary | ICD-10-CM | POA: Diagnosis not present

## 2013-07-25 DIAGNOSIS — E039 Hypothyroidism, unspecified: Secondary | ICD-10-CM | POA: Diagnosis not present

## 2013-08-06 DIAGNOSIS — R7309 Other abnormal glucose: Secondary | ICD-10-CM | POA: Diagnosis not present

## 2013-08-06 DIAGNOSIS — E039 Hypothyroidism, unspecified: Secondary | ICD-10-CM | POA: Diagnosis not present

## 2013-08-06 DIAGNOSIS — E7800 Pure hypercholesterolemia, unspecified: Secondary | ICD-10-CM | POA: Diagnosis not present

## 2013-08-06 DIAGNOSIS — I10 Essential (primary) hypertension: Secondary | ICD-10-CM | POA: Diagnosis not present

## 2013-08-07 DIAGNOSIS — D0420 Carcinoma in situ of skin of unspecified ear and external auricular canal: Secondary | ICD-10-CM | POA: Diagnosis not present

## 2013-08-07 DIAGNOSIS — C44221 Squamous cell carcinoma of skin of unspecified ear and external auricular canal: Secondary | ICD-10-CM | POA: Diagnosis not present

## 2013-08-07 DIAGNOSIS — L570 Actinic keratosis: Secondary | ICD-10-CM | POA: Diagnosis not present

## 2013-08-07 DIAGNOSIS — C44319 Basal cell carcinoma of skin of other parts of face: Secondary | ICD-10-CM | POA: Diagnosis not present

## 2013-11-07 DIAGNOSIS — E039 Hypothyroidism, unspecified: Secondary | ICD-10-CM | POA: Diagnosis not present

## 2013-11-07 DIAGNOSIS — I10 Essential (primary) hypertension: Secondary | ICD-10-CM | POA: Diagnosis not present

## 2013-12-19 DIAGNOSIS — E039 Hypothyroidism, unspecified: Secondary | ICD-10-CM | POA: Diagnosis not present

## 2013-12-19 DIAGNOSIS — R7309 Other abnormal glucose: Secondary | ICD-10-CM | POA: Diagnosis not present

## 2013-12-19 DIAGNOSIS — M353 Polymyalgia rheumatica: Secondary | ICD-10-CM | POA: Diagnosis not present

## 2013-12-19 DIAGNOSIS — I10 Essential (primary) hypertension: Secondary | ICD-10-CM | POA: Diagnosis not present

## 2014-02-01 DIAGNOSIS — H02839 Dermatochalasis of unspecified eye, unspecified eyelid: Secondary | ICD-10-CM | POA: Diagnosis not present

## 2014-02-01 DIAGNOSIS — Z961 Presence of intraocular lens: Secondary | ICD-10-CM | POA: Diagnosis not present

## 2014-02-05 DIAGNOSIS — L821 Other seborrheic keratosis: Secondary | ICD-10-CM | POA: Diagnosis not present

## 2014-02-05 DIAGNOSIS — L570 Actinic keratosis: Secondary | ICD-10-CM | POA: Diagnosis not present

## 2014-02-05 DIAGNOSIS — C44621 Squamous cell carcinoma of skin of unspecified upper limb, including shoulder: Secondary | ICD-10-CM | POA: Diagnosis not present

## 2014-02-05 DIAGNOSIS — D485 Neoplasm of uncertain behavior of skin: Secondary | ICD-10-CM | POA: Diagnosis not present

## 2014-02-07 DIAGNOSIS — Z23 Encounter for immunization: Secondary | ICD-10-CM | POA: Diagnosis not present

## 2014-03-26 DIAGNOSIS — R7611 Nonspecific reaction to tuberculin skin test without active tuberculosis: Secondary | ICD-10-CM | POA: Diagnosis not present

## 2014-05-20 DIAGNOSIS — I10 Essential (primary) hypertension: Secondary | ICD-10-CM | POA: Diagnosis not present

## 2014-05-20 DIAGNOSIS — R739 Hyperglycemia, unspecified: Secondary | ICD-10-CM | POA: Diagnosis not present

## 2014-05-20 DIAGNOSIS — M353 Polymyalgia rheumatica: Secondary | ICD-10-CM | POA: Diagnosis not present

## 2014-05-20 DIAGNOSIS — E039 Hypothyroidism, unspecified: Secondary | ICD-10-CM | POA: Diagnosis not present

## 2014-05-23 DIAGNOSIS — M353 Polymyalgia rheumatica: Secondary | ICD-10-CM | POA: Diagnosis not present

## 2014-05-23 DIAGNOSIS — E119 Type 2 diabetes mellitus without complications: Secondary | ICD-10-CM | POA: Diagnosis not present

## 2014-05-23 DIAGNOSIS — I10 Essential (primary) hypertension: Secondary | ICD-10-CM | POA: Diagnosis not present

## 2014-05-23 DIAGNOSIS — E039 Hypothyroidism, unspecified: Secondary | ICD-10-CM | POA: Diagnosis not present

## 2014-07-29 DIAGNOSIS — M79602 Pain in left arm: Secondary | ICD-10-CM | POA: Diagnosis not present

## 2014-07-31 ENCOUNTER — Ambulatory Visit: Payer: Medicare Other | Admitting: Cardiovascular Disease

## 2014-08-22 DIAGNOSIS — E039 Hypothyroidism, unspecified: Secondary | ICD-10-CM | POA: Diagnosis not present

## 2014-08-22 DIAGNOSIS — I10 Essential (primary) hypertension: Secondary | ICD-10-CM | POA: Diagnosis not present

## 2014-08-22 DIAGNOSIS — E119 Type 2 diabetes mellitus without complications: Secondary | ICD-10-CM | POA: Diagnosis not present

## 2014-08-27 DIAGNOSIS — R7309 Other abnormal glucose: Secondary | ICD-10-CM | POA: Diagnosis not present

## 2014-08-27 DIAGNOSIS — E039 Hypothyroidism, unspecified: Secondary | ICD-10-CM | POA: Diagnosis not present

## 2014-08-27 DIAGNOSIS — I10 Essential (primary) hypertension: Secondary | ICD-10-CM | POA: Diagnosis not present

## 2014-10-10 DIAGNOSIS — I10 Essential (primary) hypertension: Secondary | ICD-10-CM | POA: Diagnosis not present

## 2014-10-17 DIAGNOSIS — I10 Essential (primary) hypertension: Secondary | ICD-10-CM | POA: Diagnosis not present

## 2014-10-17 DIAGNOSIS — L570 Actinic keratosis: Secondary | ICD-10-CM | POA: Diagnosis not present

## 2014-10-17 DIAGNOSIS — E039 Hypothyroidism, unspecified: Secondary | ICD-10-CM | POA: Diagnosis not present

## 2014-10-17 DIAGNOSIS — E780 Pure hypercholesterolemia: Secondary | ICD-10-CM | POA: Diagnosis not present

## 2015-01-16 DIAGNOSIS — E039 Hypothyroidism, unspecified: Secondary | ICD-10-CM | POA: Diagnosis not present

## 2015-01-16 DIAGNOSIS — R7309 Other abnormal glucose: Secondary | ICD-10-CM | POA: Diagnosis not present

## 2015-01-16 DIAGNOSIS — I10 Essential (primary) hypertension: Secondary | ICD-10-CM | POA: Diagnosis not present

## 2015-01-21 DIAGNOSIS — E039 Hypothyroidism, unspecified: Secondary | ICD-10-CM | POA: Diagnosis not present

## 2015-01-21 DIAGNOSIS — L570 Actinic keratosis: Secondary | ICD-10-CM | POA: Diagnosis not present

## 2015-01-21 DIAGNOSIS — R739 Hyperglycemia, unspecified: Secondary | ICD-10-CM | POA: Diagnosis not present

## 2015-01-21 DIAGNOSIS — I10 Essential (primary) hypertension: Secondary | ICD-10-CM | POA: Diagnosis not present

## 2015-05-22 DIAGNOSIS — I10 Essential (primary) hypertension: Secondary | ICD-10-CM | POA: Diagnosis not present

## 2015-05-22 DIAGNOSIS — R739 Hyperglycemia, unspecified: Secondary | ICD-10-CM | POA: Diagnosis not present

## 2015-05-22 DIAGNOSIS — E039 Hypothyroidism, unspecified: Secondary | ICD-10-CM | POA: Diagnosis not present

## 2015-05-29 DIAGNOSIS — E7800 Pure hypercholesterolemia, unspecified: Secondary | ICD-10-CM | POA: Diagnosis not present

## 2015-05-29 DIAGNOSIS — E039 Hypothyroidism, unspecified: Secondary | ICD-10-CM | POA: Diagnosis not present

## 2015-05-29 DIAGNOSIS — R739 Hyperglycemia, unspecified: Secondary | ICD-10-CM | POA: Diagnosis not present

## 2015-05-29 DIAGNOSIS — I10 Essential (primary) hypertension: Secondary | ICD-10-CM | POA: Diagnosis not present

## 2015-09-22 DIAGNOSIS — E039 Hypothyroidism, unspecified: Secondary | ICD-10-CM | POA: Diagnosis not present

## 2015-09-22 DIAGNOSIS — I10 Essential (primary) hypertension: Secondary | ICD-10-CM | POA: Diagnosis not present

## 2015-09-25 DIAGNOSIS — I10 Essential (primary) hypertension: Secondary | ICD-10-CM | POA: Diagnosis not present

## 2015-09-25 DIAGNOSIS — L570 Actinic keratosis: Secondary | ICD-10-CM | POA: Diagnosis not present

## 2015-09-25 DIAGNOSIS — R739 Hyperglycemia, unspecified: Secondary | ICD-10-CM | POA: Diagnosis not present

## 2015-09-25 DIAGNOSIS — E039 Hypothyroidism, unspecified: Secondary | ICD-10-CM | POA: Diagnosis not present

## 2015-10-23 DIAGNOSIS — I10 Essential (primary) hypertension: Secondary | ICD-10-CM | POA: Diagnosis not present

## 2015-10-23 DIAGNOSIS — E039 Hypothyroidism, unspecified: Secondary | ICD-10-CM | POA: Diagnosis not present

## 2015-10-23 DIAGNOSIS — E7800 Pure hypercholesterolemia, unspecified: Secondary | ICD-10-CM | POA: Diagnosis not present

## 2015-11-25 ENCOUNTER — Ambulatory Visit (HOSPITAL_COMMUNITY)
Admission: EM | Admit: 2015-11-25 | Discharge: 2015-11-25 | Disposition: A | Discharge status: Home / Self Care | Payer: Medicare Other | Attending: Family Medicine | Admitting: Family Medicine

## 2015-11-25 ENCOUNTER — Encounter (HOSPITAL_COMMUNITY): Payer: Self-pay | Admitting: Emergency Medicine

## 2015-11-25 DIAGNOSIS — I10 Essential (primary) hypertension: Secondary | ICD-10-CM

## 2015-11-25 NOTE — ED Notes (Signed)
The patient presented to the [facility] with a complaint of HTN that started today. The patient stated that he was exposed to Round Up weed killer 2 days ago and was concerned that could be the cause.

## 2015-11-25 NOTE — Discharge Instructions (Signed)
There will be no change of your medications at this time.   You should call Dr. Maudie Mercury tomorrow and set up an appointment  You should continue to take all your medications as Dr. Maudie Mercury has directed.   If there is new or worsening of your symptoms  You should go to the Emergency Department

## 2015-11-25 NOTE — ED Provider Notes (Signed)
CSN: [id number]     Arrival date & time 11/25/15  1646 History   First MD Initiated Contact with Patient 11/25/15 1725     Chief Complaint  Patient presents with  . Hypertension   (Consider location/radiation/quality/duration/timing/severity/associated sxs/prior Treatment) HPI History obtained from patient:  Pt presents with the cc of:  High blood pressure Duration of symptoms: Today Treatment prior to arrival: None Context: Patient states that he was using a weed killer 2 days ago and today his blood pressure is elevated he is concerned that there is some correlation between the two. He states that he has no symptoms other symptoms Other symptoms include: Some pain in his left sciatic Pain score: 2 FAMILY HISTORY: Hypertension    Past Medical History  Diagnosis Date  . Hypertension   . Diabetes mellitus without complication ([place])   . Thyroid disease   . Hypothyroidism   . Left arm pain   . Hiatal hernia   . BPH (benign prostatic hyperplasia)   . Zenker diverticulum   . Renal calculus     left   Past Surgical History  Procedure Laterality Date  . Prostate surgery    . Throat surgery    . Retinal detachment surgery    . Cataracts Bilateral    Family History  Problem Relation Age of Onset  . CAD Mother   . CVA Mother   . Heart attack Father   . Heart attack Brother    Social History  Substance Use Topics  . Smoking status: Former Research scientist (life sciences)  . Smokeless tobacco: None  . Alcohol Use: No    Review of Systems  Denies: HEADACHE, NAUSEA, ABDOMINAL PAIN, CHEST PAIN, CONGESTION, DYSURIA, SHORTNESS OF BREATH  Allergies  Review of patient's allergies indicates no known allergies.  Home Medications   Prior to Admission medications   Medication Sig Start Date End Date Taking? Authorizing Provider  amLODipine (NORVASC) 5 MG tablet Take 1 tablet (5 mg total) by mouth daily. 04/23/13  Yes Ripudeep Krystal Eaton, MD  aspirin EC 81 MG tablet Take 1 tablet (81 mg total) by mouth  daily. 04/23/13  Yes Ripudeep Krystal Eaton, MD  levothyroxine (SYNTHROID, LEVOTHROID) 25 MCG tablet Take 25 mcg by mouth every evening.   Yes Historical Provider, MD  lisinopril-hydrochlorothiazide (PRINZIDE,ZESTORETIC) 20-12.5 MG per tablet Take 1 tablet by mouth every evening.   Yes Historical Provider, MD  metFORMIN (GLUCOPHAGE) 500 MG tablet Take 250 mg by mouth every evening.   Yes Historical Provider, MD  terazosin (HYTRIN) 2 MG capsule Take 2 mg by mouth every evening.   Yes Historical Provider, MD  vitamin B-12 (CYANOCOBALAMIN) 1000 MCG tablet Take 1,000 mcg by mouth every evening.   Yes Historical Provider, MD  CINNAMON PO Take 1,000 mg by mouth every evening.    Historical Provider, MD  simvastatin (ZOCOR) 10 MG tablet Take 10 mg by mouth daily.    Historical Provider, MD   Meds Ordered and Administered this Visit  Medications - No data to display  BP 217/90 mmHg  Pulse 69  Temp(Src) 98.4 F (36.9 C) (Oral)  Resp 18  SpO2 95% No data found.   Physical Exam NURSES NOTES AND VITAL SIGNS REVIEWED. CONSTITUTIONAL: Well developed, well nourished, no acute distress HEENT: normocephalic, atraumatic EYES: Conjunctiva normal NECK:normal ROM, supple, no adenopathy PULMONARY:No respiratory distress, normal effort ABDOMINAL: Soft, ND, NT BS+, No CVAT MUSCULOSKELETAL: Normal ROM of all extremities,  SKIN: warm and dry without rash PSYCHIATRIC: Mood and affect, behavior are normal  ED Course  Procedures (including critical care time)  Labs Review Labs Reviewed - No data to display  Imaging Review No results found.   Visual Acuity Review  Right Eye Distance:   Left Eye Distance:   Bilateral Distance:    Right Eye Near:   Left Eye Near:    Bilateral Near:       Patient is advised that he needs to contact his primary care provider tomorrow to arrange follow-up and have his blood pressure medicines adjusted if needed. Is also advised not to take his blood pressure so much  during the day as there are no intraoral spikes and the blood pressure during the day. Patient states that he does not wish to have his blood pressure medicines adjusted he would like reassurance that the spray (weed killer) is not the issue. Reassurance is provided to the patient. I also discussed that he is not having any neurological symptoms at this time. There is no indication for other testing at this time. Patient also states that he is quite happy taking an occasional ibuprofen for her sciatica pain and does not wish for this to be treated with medications.  MDM   1. Essential hypertension     Patient is reassured that there are no issues that require transfer to higher level of care at this time or additional tests. Patient is advised to continue home symptomatic treatment. Patient is advised that if there are new or worsening symptoms to attend the emergency department, contact primary care provider, or return to UC. Instructions of care provided discharged home in stable condition.    THIS NOTE WAS GENERATED USING A VOICE RECOGNITION SOFTWARE PROGRAM. ALL REASONABLE EFFORTS  WERE MADE TO PROOFREAD THIS DOCUMENT FOR ACCURACY.  I have verbally reviewed the discharge instructions with the patient. A printed AVS was given to the patient.  All questions were answered prior to discharge.      Konrad Felix, [place] 11/25/15 Bosie Helper

## 2015-11-26 DIAGNOSIS — E7800 Pure hypercholesterolemia, unspecified: Secondary | ICD-10-CM | POA: Diagnosis not present

## 2015-11-26 DIAGNOSIS — I10 Essential (primary) hypertension: Secondary | ICD-10-CM | POA: Diagnosis not present

## 2015-11-26 DIAGNOSIS — E039 Hypothyroidism, unspecified: Secondary | ICD-10-CM | POA: Diagnosis not present

## 2015-11-27 DIAGNOSIS — I10 Essential (primary) hypertension: Secondary | ICD-10-CM | POA: Diagnosis not present

## 2015-12-08 DIAGNOSIS — I10 Essential (primary) hypertension: Secondary | ICD-10-CM | POA: Diagnosis not present

## 2015-12-08 DIAGNOSIS — M5432 Sciatica, left side: Secondary | ICD-10-CM | POA: Diagnosis not present

## 2015-12-19 DIAGNOSIS — M79602 Pain in left arm: Secondary | ICD-10-CM | POA: Diagnosis not present

## 2015-12-24 DIAGNOSIS — M5442 Lumbago with sciatica, left side: Secondary | ICD-10-CM | POA: Diagnosis not present

## 2016-01-02 DIAGNOSIS — M5136 Other intervertebral disc degeneration, lumbar region: Secondary | ICD-10-CM | POA: Diagnosis not present

## 2016-01-02 DIAGNOSIS — M5442 Lumbago with sciatica, left side: Secondary | ICD-10-CM | POA: Diagnosis not present

## 2016-01-13 DIAGNOSIS — M5442 Lumbago with sciatica, left side: Secondary | ICD-10-CM | POA: Diagnosis not present

## 2016-02-18 DIAGNOSIS — I10 Essential (primary) hypertension: Secondary | ICD-10-CM | POA: Diagnosis not present

## 2016-02-18 DIAGNOSIS — E039 Hypothyroidism, unspecified: Secondary | ICD-10-CM | POA: Diagnosis not present

## 2016-02-23 DIAGNOSIS — Z23 Encounter for immunization: Secondary | ICD-10-CM | POA: Diagnosis not present

## 2016-02-23 DIAGNOSIS — I10 Essential (primary) hypertension: Secondary | ICD-10-CM | POA: Diagnosis not present

## 2016-02-23 DIAGNOSIS — N400 Enlarged prostate without lower urinary tract symptoms: Secondary | ICD-10-CM | POA: Diagnosis not present

## 2016-02-23 DIAGNOSIS — E039 Hypothyroidism, unspecified: Secondary | ICD-10-CM | POA: Diagnosis not present

## 2016-02-23 DIAGNOSIS — E7800 Pure hypercholesterolemia, unspecified: Secondary | ICD-10-CM | POA: Diagnosis not present

## 2016-03-16 DIAGNOSIS — C44311 Basal cell carcinoma of skin of nose: Secondary | ICD-10-CM | POA: Diagnosis not present

## 2016-03-16 DIAGNOSIS — L570 Actinic keratosis: Secondary | ICD-10-CM | POA: Diagnosis not present

## 2016-03-16 DIAGNOSIS — D485 Neoplasm of uncertain behavior of skin: Secondary | ICD-10-CM | POA: Diagnosis not present

## 2016-04-27 DIAGNOSIS — C44629 Squamous cell carcinoma of skin of left upper limb, including shoulder: Secondary | ICD-10-CM | POA: Diagnosis not present

## 2016-04-27 DIAGNOSIS — Z85828 Personal history of other malignant neoplasm of skin: Secondary | ICD-10-CM | POA: Diagnosis not present

## 2016-04-27 DIAGNOSIS — L570 Actinic keratosis: Secondary | ICD-10-CM | POA: Diagnosis not present

## 2016-05-20 DIAGNOSIS — E039 Hypothyroidism, unspecified: Secondary | ICD-10-CM | POA: Diagnosis not present

## 2016-05-20 DIAGNOSIS — I10 Essential (primary) hypertension: Secondary | ICD-10-CM | POA: Diagnosis not present

## 2016-05-20 DIAGNOSIS — N400 Enlarged prostate without lower urinary tract symptoms: Secondary | ICD-10-CM | POA: Diagnosis not present

## 2016-05-26 DIAGNOSIS — R1013 Epigastric pain: Secondary | ICD-10-CM | POA: Diagnosis not present

## 2016-05-26 DIAGNOSIS — E785 Hyperlipidemia, unspecified: Secondary | ICD-10-CM | POA: Diagnosis not present

## 2016-05-26 DIAGNOSIS — I10 Essential (primary) hypertension: Secondary | ICD-10-CM | POA: Diagnosis not present

## 2016-06-09 DIAGNOSIS — E7800 Pure hypercholesterolemia, unspecified: Secondary | ICD-10-CM | POA: Diagnosis not present

## 2016-06-09 DIAGNOSIS — E039 Hypothyroidism, unspecified: Secondary | ICD-10-CM | POA: Diagnosis not present

## 2016-06-09 DIAGNOSIS — I10 Essential (primary) hypertension: Secondary | ICD-10-CM | POA: Diagnosis not present

## 2016-06-09 DIAGNOSIS — M791 Myalgia: Secondary | ICD-10-CM | POA: Diagnosis not present

## 2016-06-15 DIAGNOSIS — L905 Scar conditions and fibrosis of skin: Secondary | ICD-10-CM | POA: Diagnosis not present

## 2016-06-15 DIAGNOSIS — Z85828 Personal history of other malignant neoplasm of skin: Secondary | ICD-10-CM | POA: Diagnosis not present

## 2016-06-15 DIAGNOSIS — L570 Actinic keratosis: Secondary | ICD-10-CM | POA: Diagnosis not present

## 2016-08-23 DIAGNOSIS — H01005 Unspecified blepharitis left lower eyelid: Secondary | ICD-10-CM | POA: Diagnosis not present

## 2016-08-23 DIAGNOSIS — H0011 Chalazion right upper eyelid: Secondary | ICD-10-CM | POA: Diagnosis not present

## 2016-08-23 DIAGNOSIS — H01002 Unspecified blepharitis right lower eyelid: Secondary | ICD-10-CM | POA: Diagnosis not present

## 2016-09-01 DIAGNOSIS — M791 Myalgia: Secondary | ICD-10-CM | POA: Diagnosis not present

## 2016-09-01 DIAGNOSIS — I10 Essential (primary) hypertension: Secondary | ICD-10-CM | POA: Diagnosis not present

## 2016-09-01 DIAGNOSIS — E039 Hypothyroidism, unspecified: Secondary | ICD-10-CM | POA: Diagnosis not present

## 2016-09-02 DIAGNOSIS — E119 Type 2 diabetes mellitus without complications: Secondary | ICD-10-CM | POA: Diagnosis not present

## 2016-09-02 DIAGNOSIS — E118 Type 2 diabetes mellitus with unspecified complications: Secondary | ICD-10-CM | POA: Diagnosis not present

## 2016-09-02 DIAGNOSIS — I10 Essential (primary) hypertension: Secondary | ICD-10-CM | POA: Diagnosis not present

## 2016-09-02 DIAGNOSIS — N400 Enlarged prostate without lower urinary tract symptoms: Secondary | ICD-10-CM | POA: Diagnosis not present

## 2016-09-02 DIAGNOSIS — E039 Hypothyroidism, unspecified: Secondary | ICD-10-CM | POA: Diagnosis not present

## 2016-09-08 DIAGNOSIS — H0012 Chalazion right lower eyelid: Secondary | ICD-10-CM | POA: Diagnosis not present

## 2016-09-08 DIAGNOSIS — H02102 Unspecified ectropion of right lower eyelid: Secondary | ICD-10-CM | POA: Diagnosis not present

## 2016-10-14 DIAGNOSIS — K5909 Other constipation: Secondary | ICD-10-CM | POA: Diagnosis not present

## 2016-11-15 DIAGNOSIS — H0012 Chalazion right lower eyelid: Secondary | ICD-10-CM | POA: Diagnosis not present

## 2016-11-25 DIAGNOSIS — E118 Type 2 diabetes mellitus with unspecified complications: Secondary | ICD-10-CM | POA: Diagnosis not present

## 2016-11-25 DIAGNOSIS — I10 Essential (primary) hypertension: Secondary | ICD-10-CM | POA: Diagnosis not present

## 2016-11-25 DIAGNOSIS — N400 Enlarged prostate without lower urinary tract symptoms: Secondary | ICD-10-CM | POA: Diagnosis not present

## 2016-11-29 DIAGNOSIS — H52203 Unspecified astigmatism, bilateral: Secondary | ICD-10-CM | POA: Diagnosis not present

## 2016-11-29 DIAGNOSIS — H02102 Unspecified ectropion of right lower eyelid: Secondary | ICD-10-CM | POA: Diagnosis not present

## 2016-11-29 DIAGNOSIS — Z961 Presence of intraocular lens: Secondary | ICD-10-CM | POA: Diagnosis not present

## 2016-12-02 DIAGNOSIS — I10 Essential (primary) hypertension: Secondary | ICD-10-CM | POA: Diagnosis not present

## 2016-12-02 DIAGNOSIS — E119 Type 2 diabetes mellitus without complications: Secondary | ICD-10-CM | POA: Diagnosis not present

## 2016-12-02 DIAGNOSIS — E039 Hypothyroidism, unspecified: Secondary | ICD-10-CM | POA: Diagnosis not present

## 2017-02-23 DIAGNOSIS — E039 Hypothyroidism, unspecified: Secondary | ICD-10-CM | POA: Diagnosis not present

## 2017-02-23 DIAGNOSIS — E7800 Pure hypercholesterolemia, unspecified: Secondary | ICD-10-CM | POA: Diagnosis not present

## 2017-02-23 DIAGNOSIS — I10 Essential (primary) hypertension: Secondary | ICD-10-CM | POA: Diagnosis not present

## 2017-02-23 DIAGNOSIS — E119 Type 2 diabetes mellitus without complications: Secondary | ICD-10-CM | POA: Diagnosis not present

## 2017-03-03 DIAGNOSIS — Z23 Encounter for immunization: Secondary | ICD-10-CM | POA: Diagnosis not present

## 2017-03-03 DIAGNOSIS — K225 Diverticulum of esophagus, acquired: Secondary | ICD-10-CM | POA: Diagnosis not present

## 2017-03-03 DIAGNOSIS — E7800 Pure hypercholesterolemia, unspecified: Secondary | ICD-10-CM | POA: Diagnosis not present

## 2017-03-03 DIAGNOSIS — I10 Essential (primary) hypertension: Secondary | ICD-10-CM | POA: Diagnosis not present

## 2017-03-03 DIAGNOSIS — E039 Hypothyroidism, unspecified: Secondary | ICD-10-CM | POA: Diagnosis not present

## 2017-06-06 DIAGNOSIS — E039 Hypothyroidism, unspecified: Secondary | ICD-10-CM | POA: Diagnosis not present

## 2017-06-06 DIAGNOSIS — I10 Essential (primary) hypertension: Secondary | ICD-10-CM | POA: Diagnosis not present

## 2017-06-13 DIAGNOSIS — E039 Hypothyroidism, unspecified: Secondary | ICD-10-CM | POA: Diagnosis not present

## 2017-06-13 DIAGNOSIS — I10 Essential (primary) hypertension: Secondary | ICD-10-CM | POA: Diagnosis not present

## 2017-06-13 DIAGNOSIS — R739 Hyperglycemia, unspecified: Secondary | ICD-10-CM | POA: Diagnosis not present

## 2017-06-13 DIAGNOSIS — M353 Polymyalgia rheumatica: Secondary | ICD-10-CM | POA: Diagnosis not present

## 2017-06-13 DIAGNOSIS — E7800 Pure hypercholesterolemia, unspecified: Secondary | ICD-10-CM | POA: Diagnosis not present

## 2017-08-26 DIAGNOSIS — C44629 Squamous cell carcinoma of skin of left upper limb, including shoulder: Secondary | ICD-10-CM | POA: Diagnosis not present

## 2017-08-26 DIAGNOSIS — D225 Melanocytic nevi of trunk: Secondary | ICD-10-CM | POA: Diagnosis not present

## 2017-08-26 DIAGNOSIS — L821 Other seborrheic keratosis: Secondary | ICD-10-CM | POA: Diagnosis not present

## 2017-08-26 DIAGNOSIS — L570 Actinic keratosis: Secondary | ICD-10-CM | POA: Diagnosis not present

## 2017-08-26 DIAGNOSIS — Z85828 Personal history of other malignant neoplasm of skin: Secondary | ICD-10-CM | POA: Diagnosis not present

## 2017-09-09 DIAGNOSIS — E039 Hypothyroidism, unspecified: Secondary | ICD-10-CM | POA: Diagnosis not present

## 2017-09-09 DIAGNOSIS — I10 Essential (primary) hypertension: Secondary | ICD-10-CM | POA: Diagnosis not present

## 2017-09-09 DIAGNOSIS — R739 Hyperglycemia, unspecified: Secondary | ICD-10-CM | POA: Diagnosis not present

## 2017-09-09 DIAGNOSIS — E118 Type 2 diabetes mellitus with unspecified complications: Secondary | ICD-10-CM | POA: Diagnosis not present

## 2017-09-16 DIAGNOSIS — E119 Type 2 diabetes mellitus without complications: Secondary | ICD-10-CM | POA: Diagnosis not present

## 2017-09-16 DIAGNOSIS — I10 Essential (primary) hypertension: Secondary | ICD-10-CM | POA: Diagnosis not present

## 2017-09-16 DIAGNOSIS — E039 Hypothyroidism, unspecified: Secondary | ICD-10-CM | POA: Diagnosis not present

## 2017-09-16 DIAGNOSIS — E7800 Pure hypercholesterolemia, unspecified: Secondary | ICD-10-CM | POA: Diagnosis not present

## 2017-10-12 DIAGNOSIS — Z961 Presence of intraocular lens: Secondary | ICD-10-CM | POA: Diagnosis not present

## 2017-10-26 DIAGNOSIS — D225 Melanocytic nevi of trunk: Secondary | ICD-10-CM | POA: Diagnosis not present

## 2017-10-26 DIAGNOSIS — C44319 Basal cell carcinoma of skin of other parts of face: Secondary | ICD-10-CM | POA: Diagnosis not present

## 2017-10-26 DIAGNOSIS — Z85828 Personal history of other malignant neoplasm of skin: Secondary | ICD-10-CM | POA: Diagnosis not present

## 2017-10-26 DIAGNOSIS — L821 Other seborrheic keratosis: Secondary | ICD-10-CM | POA: Diagnosis not present

## 2017-10-26 DIAGNOSIS — D485 Neoplasm of uncertain behavior of skin: Secondary | ICD-10-CM | POA: Diagnosis not present

## 2017-10-26 DIAGNOSIS — L814 Other melanin hyperpigmentation: Secondary | ICD-10-CM | POA: Diagnosis not present

## 2017-10-26 DIAGNOSIS — C44629 Squamous cell carcinoma of skin of left upper limb, including shoulder: Secondary | ICD-10-CM | POA: Diagnosis not present

## 2017-10-26 DIAGNOSIS — L570 Actinic keratosis: Secondary | ICD-10-CM | POA: Diagnosis not present

## 2017-10-28 DIAGNOSIS — C44629 Squamous cell carcinoma of skin of left upper limb, including shoulder: Secondary | ICD-10-CM | POA: Diagnosis not present

## 2017-10-28 DIAGNOSIS — C44319 Basal cell carcinoma of skin of other parts of face: Secondary | ICD-10-CM | POA: Diagnosis not present

## 2017-11-10 DIAGNOSIS — C44311 Basal cell carcinoma of skin of nose: Secondary | ICD-10-CM | POA: Diagnosis not present

## 2017-11-10 DIAGNOSIS — L570 Actinic keratosis: Secondary | ICD-10-CM | POA: Diagnosis not present

## 2017-11-10 DIAGNOSIS — D485 Neoplasm of uncertain behavior of skin: Secondary | ICD-10-CM | POA: Diagnosis not present

## 2017-11-10 DIAGNOSIS — C44629 Squamous cell carcinoma of skin of left upper limb, including shoulder: Secondary | ICD-10-CM | POA: Diagnosis not present

## 2017-11-10 DIAGNOSIS — C44622 Squamous cell carcinoma of skin of right upper limb, including shoulder: Secondary | ICD-10-CM | POA: Diagnosis not present

## 2017-12-19 DIAGNOSIS — E039 Hypothyroidism, unspecified: Secondary | ICD-10-CM | POA: Diagnosis not present

## 2017-12-19 DIAGNOSIS — I10 Essential (primary) hypertension: Secondary | ICD-10-CM | POA: Diagnosis not present

## 2017-12-19 DIAGNOSIS — E119 Type 2 diabetes mellitus without complications: Secondary | ICD-10-CM | POA: Diagnosis not present

## 2017-12-21 DIAGNOSIS — L570 Actinic keratosis: Secondary | ICD-10-CM | POA: Diagnosis not present

## 2017-12-21 DIAGNOSIS — C44311 Basal cell carcinoma of skin of nose: Secondary | ICD-10-CM | POA: Diagnosis not present

## 2018-02-03 DIAGNOSIS — N400 Enlarged prostate without lower urinary tract symptoms: Secondary | ICD-10-CM | POA: Diagnosis not present

## 2018-02-03 DIAGNOSIS — E119 Type 2 diabetes mellitus without complications: Secondary | ICD-10-CM | POA: Diagnosis not present

## 2018-02-03 DIAGNOSIS — I10 Essential (primary) hypertension: Secondary | ICD-10-CM | POA: Diagnosis not present

## 2018-02-03 DIAGNOSIS — E039 Hypothyroidism, unspecified: Secondary | ICD-10-CM | POA: Diagnosis not present

## 2018-02-03 DIAGNOSIS — Z23 Encounter for immunization: Secondary | ICD-10-CM | POA: Diagnosis not present

## 2018-02-03 DIAGNOSIS — E7800 Pure hypercholesterolemia, unspecified: Secondary | ICD-10-CM | POA: Diagnosis not present

## 2018-02-13 DIAGNOSIS — R2681 Unsteadiness on feet: Secondary | ICD-10-CM | POA: Diagnosis not present

## 2018-02-13 DIAGNOSIS — R2689 Other abnormalities of gait and mobility: Secondary | ICD-10-CM | POA: Diagnosis not present

## 2018-02-22 DIAGNOSIS — I10 Essential (primary) hypertension: Secondary | ICD-10-CM | POA: Diagnosis not present

## 2018-02-23 DIAGNOSIS — R2681 Unsteadiness on feet: Secondary | ICD-10-CM | POA: Diagnosis not present

## 2018-02-23 DIAGNOSIS — R2689 Other abnormalities of gait and mobility: Secondary | ICD-10-CM | POA: Diagnosis not present

## 2018-03-02 DIAGNOSIS — R2689 Other abnormalities of gait and mobility: Secondary | ICD-10-CM | POA: Diagnosis not present

## 2018-03-02 DIAGNOSIS — R2681 Unsteadiness on feet: Secondary | ICD-10-CM | POA: Diagnosis not present

## 2018-03-09 DIAGNOSIS — R2689 Other abnormalities of gait and mobility: Secondary | ICD-10-CM | POA: Diagnosis not present

## 2018-03-09 DIAGNOSIS — R2681 Unsteadiness on feet: Secondary | ICD-10-CM | POA: Diagnosis not present

## 2018-07-17 DIAGNOSIS — H02102 Unspecified ectropion of right lower eyelid: Secondary | ICD-10-CM | POA: Diagnosis not present

## 2018-07-17 DIAGNOSIS — Z961 Presence of intraocular lens: Secondary | ICD-10-CM | POA: Diagnosis not present

## 2018-09-26 ENCOUNTER — Emergency Department (HOSPITAL_COMMUNITY)
Admission: EM | Admit: 2018-09-26 | Discharge: 2018-09-26 | Disposition: A | Discharge status: Home / Self Care | Payer: Medicare Other | Attending: Emergency Medicine | Admitting: Emergency Medicine

## 2018-09-26 ENCOUNTER — Encounter (HOSPITAL_COMMUNITY): Payer: Self-pay | Admitting: Emergency Medicine

## 2018-09-26 ENCOUNTER — Emergency Department (HOSPITAL_COMMUNITY): Payer: Medicare Other

## 2018-09-26 ENCOUNTER — Other Ambulatory Visit: Payer: Self-pay

## 2018-09-26 DIAGNOSIS — E039 Hypothyroidism, unspecified: Secondary | ICD-10-CM | POA: Diagnosis not present

## 2018-09-26 DIAGNOSIS — I10 Essential (primary) hypertension: Secondary | ICD-10-CM | POA: Insufficient documentation

## 2018-09-26 DIAGNOSIS — Z79899 Other long term (current) drug therapy: Secondary | ICD-10-CM | POA: Diagnosis not present

## 2018-09-26 DIAGNOSIS — R410 Disorientation, unspecified: Secondary | ICD-10-CM | POA: Insufficient documentation

## 2018-09-26 DIAGNOSIS — Z7982 Long term (current) use of aspirin: Secondary | ICD-10-CM | POA: Insufficient documentation

## 2018-09-26 DIAGNOSIS — E119 Type 2 diabetes mellitus without complications: Secondary | ICD-10-CM | POA: Insufficient documentation

## 2018-09-26 DIAGNOSIS — Z7984 Long term (current) use of oral hypoglycemic drugs: Secondary | ICD-10-CM | POA: Diagnosis not present

## 2018-09-26 DIAGNOSIS — R4182 Altered mental status, unspecified: Secondary | ICD-10-CM | POA: Diagnosis not present

## 2018-09-26 DIAGNOSIS — Z87891 Personal history of nicotine dependence: Secondary | ICD-10-CM | POA: Insufficient documentation

## 2018-09-26 DIAGNOSIS — Z8673 Personal history of transient ischemic attack (TIA), and cerebral infarction without residual deficits: Secondary | ICD-10-CM | POA: Insufficient documentation

## 2018-09-26 DIAGNOSIS — R0902 Hypoxemia: Secondary | ICD-10-CM | POA: Diagnosis not present

## 2018-09-26 LAB — URINALYSIS, ROUTINE W REFLEX MICROSCOPIC
Bilirubin Urine: NEGATIVE
Glucose, UA: NEGATIVE mg/dL
Hgb urine dipstick: NEGATIVE
Ketones, ur: NEGATIVE mg/dL
Leukocytes,Ua: NEGATIVE
Nitrite: NEGATIVE
Protein, ur: NEGATIVE mg/dL
Specific Gravity, Urine: 1.015 (ref 1.005–1.030)
pH: 7 (ref 5.0–8.0)

## 2018-09-26 LAB — CBC
HCT: 38.7 % — ABNORMAL LOW (ref 39.0–52.0)
Hemoglobin: 12.6 g/dL — ABNORMAL LOW (ref 13.0–17.0)
MCH: 31.7 pg (ref 26.0–34.0)
MCHC: 32.6 g/dL (ref 30.0–36.0)
MCV: 97.2 fL (ref 80.0–100.0)
Platelets: 208 10*3/uL (ref 150–400)
RBC: 3.98 MIL/uL — ABNORMAL LOW (ref 4.22–5.81)
RDW: 12.6 % (ref 11.5–15.5)
WBC: 5.5 10*3/uL (ref 4.0–10.5)
nRBC: 0 % (ref 0.0–0.2)

## 2018-09-26 LAB — BASIC METABOLIC PANEL
Anion gap: 7 (ref 5–15)
BUN: 26 mg/dL — ABNORMAL HIGH (ref 8–23)
CO2: 27 mmol/L (ref 22–32)
Calcium: 9.4 mg/dL (ref 8.9–10.3)
Chloride: 103 mmol/L (ref 98–111)
Creatinine, Ser: 0.95 mg/dL (ref 0.61–1.24)
GFR calc Af Amer: >60 mL/min (ref >60)
GFR calc non Af Amer: >60 mL/min (ref >60)
Glucose, Bld: 158 mg/dL — ABNORMAL HIGH (ref 70–99)
Potassium: 3.9 mmol/L (ref 3.5–5.1)
Sodium: 137 mmol/L (ref 135–145)

## 2018-09-26 NOTE — ED Triage Notes (Signed)
Patient Austin Swanson x4 No complaints. Family wants him to get checked out, patient does not feel like he needs to be seen at the hospital.

## 2018-09-26 NOTE — Discharge Instructions (Addendum)
Please call your primary care physician for follow-up

## 2018-09-26 NOTE — Progress Notes (Signed)
     Home health agencies that serve [phone]. Your favorite home health agencies  [place] of Patient Care Rating Patient [place]  [phone] 3 out of 5 stars 4 out of [place]  [phone] 4  out of 5 stars 3 out of [place]  [phone] 4 out of 5 stars 4 out of [place]  [phone] 4  out of 5 stars 4 out of [place]  [phone] 4 out of 5 stars 4 out of [place]  [phone] 4 out of 5 stars 4 out of 5 stars  [facility] [place]  [phone] 3  out of 5 stars 4 out of [place]  [phone] 3 out of 5 stars 4 out of 5 stars  [facility]  [phone] 4 out of 5 stars Not Available  [facility]  [phone] 3  out of 5 stars 3 out of [place]  [phone] 3  out of 5 stars 4 out of [place]  [phone] 3  out of 5 stars 3 out of [place]  [phone] 3  out of 5 stars Not Available  [place]  [phone] 4  out of 5 stars 3 out of [place] number Footnote as displayed on [place]  1 This agency provides services under a federal waiver program to non-traditional, chronic long term population.  2 This agency provides services to a special needs population.  3 Not Available.  4 The number of patient episodes for this measure is too small to report.  5 This measure currently does not have data or provider has been certified/recertified for less than 6 months.  6 The national average for this measure is not provided because of state-to-state differences in data collection.  7 Medicare  is not displaying rates for this measure for any home health agency, because of an issue with the data.  8 There were problems with the data and they are being corrected.  9 Zero, or very few, patients met the survey's rules for inclusion. The scores shown, if any, reflect a very small number of surveys and may not accurately tell how an agency is doing.  10 Survey results are based on less than 12 months of data.  11 Fewer than 70 patients completed the survey. Use the scores shown, if any, with caution as the number of surveys may be too low to accurately tell how an agency is doing.  12 No survey results are available for this period.  13 Data suppressed by CMS for one or more quarters.

## 2018-09-26 NOTE — ED Provider Notes (Signed)
[facility] DEPT Provider Note   CSN: [id number] Arrival date & time: 09/26/18  0254    History   Chief Complaint Chief Complaint  Patient presents with  . Check up    HPI Austin Swanson is a 85 y.o. male.     HPI 85 year old male who lives in independent living center with his wife presents the emergency department because of some intermittent confusion over the past several days.  Patient is without complaints at this time.  He denies headache.  Denies chest pain.  No shortness of breath.  Denies fever and chills.  He is a retired Engineer, maintenance of a Engineer, maintenance (IT).  He routinely used to play golf.  He no longer golfs.  He ambulates with a wheeled walker.  He denies weakness in his arms or legs.  Denies back pain.  No new rash.  Denies urinary symptoms.  I spoke with his wife Austin Swanson as well as his daughter Austin Swanson both of which had nonspecific complaints of some increased confusion over the past several weeks.  Neither his wife nor his daughter have concrete examples of this.  They deny recent illness.  Oral intake is been good. Past Medical History:  Diagnosis Date  . BPH (benign prostatic hyperplasia)   . Diabetes mellitus without complication ([place])   . Hiatal hernia   . Hypertension   . Hypothyroidism   . Left arm pain   . Renal calculus    left  . Thyroid disease   . Zenker diverticulum     Patient Active Problem List   Diagnosis Date Noted  . TIA (transient ischemic attack) 04/23/2013  . HTN (hypertension) 04/23/2013  . Hypothyroidism 04/23/2013  . Diabetes ([place]) 04/23/2013    Past Surgical History:  Procedure Laterality Date  . cataracts Bilateral   . PROSTATE SURGERY    . RETINAL DETACHMENT SURGERY    . THROAT SURGERY          Home Medications    Prior to Admission medications   Medication Sig Start Date End Date Taking? Authorizing Provider  aspirin EC 81 MG tablet Take 1 tablet (81 mg total) by mouth daily. 04/23/13  Yes  Rai, Ripudeep K, MD  Cholecalciferol (VITAMIN D3) 125 MCG (5000 UT) CAPS Take 5,000 Units by mouth daily.   Yes [provider]  CINNAMON PO Take 1,000 mg by mouth every evening.   Yes [provider]  docusate sodium (COLACE) 100 MG capsule Take 100 mg by mouth daily.   Yes [provider]  levothyroxine (SYNTHROID) 50 MCG tablet Take 50 mcg by mouth daily before breakfast.   Yes [provider]  lisinopril-hydrochlorothiazide (PRINZIDE,ZESTORETIC) 20-12.5 MG per tablet Take 1 tablet by mouth every evening.   Yes [provider]  metFORMIN (GLUCOPHAGE) 500 MG tablet Take 250 mg by mouth every evening.   Yes [provider]  Pitavastatin Calcium (LIVALO) 2 MG TABS Take 1 mg by mouth daily.    Yes [provider]  terazosin (HYTRIN) 2 MG capsule Take 2 mg by mouth every evening.   Yes [provider]  vitamin B-12 (CYANOCOBALAMIN) 1000 MCG tablet Take 1,000 mcg by mouth every evening.   Yes [provider]    Family History Family History  Problem Relation Age of Onset  . CAD Mother   . CVA Mother   . Heart attack Father   . Heart attack Brother     Social History Social History   Tobacco  Use  . Smoking status: Former Smoker  Substance Use Topics  . Alcohol use: No  . Drug use: No     Allergies   Patient has no known allergies.   Review of Systems Review of Systems  All other systems reviewed and are negative.    Physical Exam Updated Vital Signs BP (!) 200/88   Pulse 62   Temp 97.7 F (36.5 C) (Oral)   Resp 18   SpO2 96%   Physical Exam Vitals signs and nursing note reviewed.  Constitutional:      Appearance: He is well-developed.  HENT:     Head: Normocephalic and atraumatic.  Neck:     Musculoskeletal: Normal range of motion.  Cardiovascular:     Rate and Rhythm: Normal rate and regular rhythm.     Heart sounds: Normal heart sounds.  Pulmonary:     Effort: Pulmonary effort  is normal. No respiratory distress.     Breath sounds: Normal breath sounds.  Abdominal:     General: There is no distension.     Palpations: Abdomen is soft.     Tenderness: There is no abdominal tenderness.  Musculoskeletal: Normal range of motion.  Skin:    General: Skin is warm and dry.  Neurological:     Mental Status: He is alert and oriented to person, place, and time.  Psychiatric:        Judgment: Judgment normal.      ED Treatments / Results  Labs (all labs ordered are listed, but only abnormal results are displayed) Labs Reviewed  CBC - Abnormal; Notable for the following components:      Result Value   RBC 3.98 (*)    Hemoglobin 12.6 (*)    HCT 38.7 (*)    All other components within normal limits  BASIC METABOLIC PANEL - Abnormal; Notable for the following components:   Glucose, Bld 158 (*)    BUN 26 (*)    All other components within normal limits  URINALYSIS, ROUTINE W REFLEX MICROSCOPIC    EKG None  Radiology Ct Head Wo Contrast  Result Date: 09/26/2018 CLINICAL DATA:  Altered mental status EXAM: CT HEAD WITHOUT CONTRAST TECHNIQUE: Contiguous axial images were obtained from the base of the skull through the vertex without intravenous contrast. COMPARISON:  04/23/2013 FINDINGS: Brain: Chronic atrophic changes and white matter ischemic changes are noted similar to that seen on the prior exam. No findings to suggest acute hemorrhage, acute infarction or space-occupying mass lesion are noted. Vascular: No hyperdense vessel or unexpected calcification. Skull: Normal. Negative for fracture or focal lesion. Sinuses/Orbits: Postsurgical changes in the right globe are noted. Other: None. IMPRESSION: Chronic atrophic and ischemic changes without acute abnormality. Electronically Signed   By: Inez Catalina M.D.   On: 09/26/2018 11:43    Procedures Procedures (including critical care time)  Medications Ordered in ED Medications - No data to display   Initial  Impression / Assessment and Plan / ED Course  I have reviewed the triage vital signs and the nursing notes.  Pertinent labs & imaging results that were available during my care of the patient were reviewed by me and considered in my medical decision making (see chart for details).        Work-up in the emergency department is without significant abnormality.  I do not think he needs additional evaluation in the emergency department today nor does he need to be acutely hospitalized.  I have discussed case with case manager who  will set him up with some home health resources.  Close primary care follow-up.  No indication for additional work-up.  Stable for discharge  Final Clinical Impressions(s) / ED Diagnoses   Final diagnoses:  Confusion    ED Discharge Orders         [place]     09/26/18 1046    Face-to-face encounter (required for Medicare/Medicaid patients)    Comments:  [place] certify that this patient is under my care and that I, or a nurse practitioner or physician's assistant working with me, had a face-to-face encounter that meets the physician face-to-face encounter requirements with this patient on 09/26/2018. The encounter with the patient was in whole, or in part for the following medical condition(s) which is the primary reason for home health care (List medical condition): increasing confusion   09/26/18 1046           Jola Schmidt, MD 09/26/18 1225

## 2018-09-26 NOTE — ED Notes (Signed)
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

## 2018-09-26 NOTE — ED Notes (Signed)
ED Provider at bedside. 

## 2018-09-26 NOTE — ED Notes (Signed)
DTR on the way to get patient

## 2018-09-26 NOTE — ED Notes (Signed)
Pt returned from CT °

## 2018-09-26 NOTE — TOC Initial Note (Signed)
Transition of Care [facility]) - Initial/Assessment Note    Patient Details  Name: Austin Swanson MRN: [id number] Date of Birth: Sep 19, 1917  Transition of Care [facility]) CM/SW Contact:    Erenest Rasher, RN Phone Number: 09/26/2018, 12:20 PM  Clinical Narrative:                 Spoke to pt and offered choice for [facility]. Pt had Rollator in room. States he lives a [place] apt with spouse. Gave permission to contact dtr, BJ. Spoke to [place], [facility] RN, Anderson Malta [phone]. States they have in house [facility] RN. Will fax orders, progress notes to #fax [phone]. [facility] states it was ok for dtr to bring him back at dc.     Expected Discharge Plan: [place] Barriers to Discharge: No Barriers Identified   Patient Goals and CMS Choice Patient states their goals for this hospitalization and ongoing recovery are:: [place], doing fine CMS Medicare.gov Compare Post Acute Care list provided to:: Patient Choice offered to / list presented to : Patient  Expected Discharge Plan and Services Expected Discharge Plan: [place] In-house Referral: NA Discharge Planning Services: CM Consult Post Acute Care Choice: [place] arrangements for the past 2 months: Independent Living Facility([facility])                 DME Arranged: N/A DME Agency: NA       HH Arranged: RN, Nurse's Aide, Social Work          Prior Living Arrangements/Services Living arrangements for the past 2 months: Independent Living Facility([facility]) Lives with:: Spouse Patient language and need for interpreter reviewed:: Yes Do you feel safe going back to the place where you live?: Yes      Need for Family Participation in Patient Care: Yes (Comment) Care giver support system in place?: Yes (comment) Current home services: DME(rollator) Criminal Activity/Legal Involvement Pertinent to Current Situation/Hospitalization: No - Comment as  needed  Activities of Daily Living      Permission Sought/Granted Permission sought to share information with : Case Manager, Customer service manager, PCP, Family Supports Permission granted to share information with : Yes, Verbal Permission Granted  Share Information with NAME: Fredirick Lathe, [place] granted to share info w Relationship: wife, daughter  Permission granted to share info w Contact Information: [phone]  Emotional Assessment Appearance:: Appears stated age Attitude/Demeanor/Rapport: Engaged Affect (typically observed): Accepting Orientation: : Oriented to Self, Oriented to Place, Oriented to  Time, Oriented to Situation   Psych Involvement: No (comment)  Admission diagnosis:  altered mental status Patient Active Problem List   Diagnosis Date Noted  . TIA (transient ischemic attack) 04/23/2013  . HTN (hypertension) 04/23/2013  . Hypothyroidism 04/23/2013  . Diabetes ([place]) 04/23/2013   PCP:  Jani Gravel, MD Pharmacy:   [facility] [address] [place] - [address] [place] [address] Phone: [phone] Fax: [phone]     Social Determinants of Health (SDOH) Interventions    Readmission Risk Interventions No flowsheet data found.

## 2018-09-26 NOTE — ED Notes (Signed)
Bed: GB15 Expected date:  Expected time:  Means of arrival:  Comments: [id number] y/o

## 2018-10-27 ENCOUNTER — Emergency Department (HOSPITAL_COMMUNITY)
Admission: EM | Admit: 2018-10-27 | Discharge: 2018-10-27 | Disposition: A | Discharge status: Home / Self Care | Payer: Medicare Other | Attending: Emergency Medicine | Admitting: Emergency Medicine

## 2018-10-27 ENCOUNTER — Emergency Department (HOSPITAL_COMMUNITY): Payer: Medicare Other

## 2018-10-27 ENCOUNTER — Encounter (HOSPITAL_COMMUNITY): Payer: Self-pay | Admitting: Emergency Medicine

## 2018-10-27 ENCOUNTER — Other Ambulatory Visit: Payer: Self-pay

## 2018-10-27 DIAGNOSIS — R05 Cough: Secondary | ICD-10-CM | POA: Diagnosis not present

## 2018-10-27 DIAGNOSIS — E119 Type 2 diabetes mellitus without complications: Secondary | ICD-10-CM | POA: Diagnosis not present

## 2018-10-27 DIAGNOSIS — Z8673 Personal history of transient ischemic attack (TIA), and cerebral infarction without residual deficits: Secondary | ICD-10-CM | POA: Insufficient documentation

## 2018-10-27 DIAGNOSIS — R059 Cough, unspecified: Secondary | ICD-10-CM

## 2018-10-27 DIAGNOSIS — Z87891 Personal history of nicotine dependence: Secondary | ICD-10-CM | POA: Diagnosis not present

## 2018-10-27 DIAGNOSIS — Z79899 Other long term (current) drug therapy: Secondary | ICD-10-CM | POA: Insufficient documentation

## 2018-10-27 DIAGNOSIS — Z7982 Long term (current) use of aspirin: Secondary | ICD-10-CM | POA: Diagnosis not present

## 2018-10-27 DIAGNOSIS — Z7984 Long term (current) use of oral hypoglycemic drugs: Secondary | ICD-10-CM | POA: Insufficient documentation

## 2018-10-27 DIAGNOSIS — J029 Acute pharyngitis, unspecified: Secondary | ICD-10-CM | POA: Insufficient documentation

## 2018-10-27 DIAGNOSIS — I10 Essential (primary) hypertension: Secondary | ICD-10-CM | POA: Diagnosis not present

## 2018-10-27 DIAGNOSIS — Z20828 Contact with and (suspected) exposure to other viral communicable diseases: Secondary | ICD-10-CM | POA: Diagnosis not present

## 2018-10-27 DIAGNOSIS — E039 Hypothyroidism, unspecified: Secondary | ICD-10-CM | POA: Insufficient documentation

## 2018-10-27 LAB — GROUP A STREP BY PCR: Group A Strep by PCR: NOT DETECTED

## 2018-10-27 MED ORDER — PANTOPRAZOLE SODIUM 20 MG PO TBEC: 20.0000 mg | DELAYED_RELEASE_TABLET | Freq: Every day | ORAL | 0 refills | Status: AC

## 2018-10-27 MED ORDER — PANTOPRAZOLE SODIUM 20 MG PO TBEC
20.0000 mg | DELAYED_RELEASE_TABLET | Freq: Once | ORAL | Status: AC
  Administered 2018-10-27: 18:00:00 20 mg via ORAL
  Filled 2018-10-27: qty 1

## 2018-10-27 NOTE — ED Triage Notes (Signed)
Pt brought in by daughter BJ from [facility] for cough that sounds congested since yesterday.  Pt denies pains, SOB or fevers. Daughter can be contacted at [phone]. Anderson Malta nurse at [facility] can be contacted at [phone].  Pt has hx having pouch where food gets stuck in throat/esophagus.

## 2018-10-27 NOTE — ED Notes (Addendum)
Patient had to use bathroom in triage. Tried to collect urine sample. Patient states" I don't need one. I'm not here for that."

## 2018-10-27 NOTE — ED Provider Notes (Addendum)
[facility] DEPT Provider Note   CSN: [id number] Arrival date & time: 10/27/18  1427    History   Chief Complaint Chief Complaint  Patient presents with  . Cough    HPI Austin Swanson is a 85 y.o. male.     HPI Patient reports he thinks he was sent here because of some sore throat and some coughing.  Believes that he was supposed to get a chest x-ray.  He reports that he gets a lot of saliva and mucus in his throat.  Has been going on for a while.  He does note however over the past couple of days he has had a more pronounced sore throat when he swallows.  Denies he is coughing any more than usual, he denies shortness of breath he denies chest pain he denies fever he denies myalgias or swelling of the extremities.  Ports overall he really feels pretty good.  He reports that he has been eating less than usual over the last day or so but he did eat yogurt today and denies he had difficulty swallowing.  I talked to the patient's daughter who reports that his nurse at his assisted living was concerned and wanted him checked for coughing and congestion.  His daughter believes that many of his symptoms seem chronic and when she drove him over she did not note that it was significantly different than normal for him. Past Medical History:  Diagnosis Date  . BPH (benign prostatic hyperplasia)   . Diabetes mellitus without complication ([place])   . Hiatal hernia   . Hypertension   . Hypothyroidism   . Left arm pain   . Renal calculus    left  . Thyroid disease   . Zenker diverticulum     Patient Active Problem List   Diagnosis Date Noted  . TIA (transient ischemic attack) 04/23/2013  . HTN (hypertension) 04/23/2013  . Hypothyroidism 04/23/2013  . Diabetes ([place]) 04/23/2013    Past Surgical History:  Procedure Laterality Date  . cataracts Bilateral   . PROSTATE SURGERY    . RETINAL DETACHMENT SURGERY    . THROAT SURGERY          Home Medications     Prior to Admission medications   Medication Sig Start Date End Date Taking? Authorizing Provider  aspirin EC 81 MG tablet Take 1 tablet (81 mg total) by mouth daily. 04/23/13  Yes Rai, Ripudeep K, MD  Cholecalciferol (VITAMIN D3) 125 MCG (5000 UT) CAPS Take 5,000 Units by mouth daily.   Yes [provider]  CINNAMON PO Take 1,000 mg by mouth every evening.   Yes [provider]  docusate sodium (COLACE) 100 MG capsule Take 100 mg by mouth daily.   Yes [provider]  levothyroxine (SYNTHROID) 50 MCG tablet Take 50 mcg by mouth daily before breakfast.   Yes [provider]  lisinopril-hydrochlorothiazide (PRINZIDE,ZESTORETIC) 20-12.5 MG per tablet Take 1 tablet by mouth every evening.   Yes [provider]  metFORMIN (GLUCOPHAGE) 500 MG tablet Take 250 mg by mouth every evening.   Yes [provider]  Pitavastatin Calcium (LIVALO) 2 MG TABS Take 1 mg by mouth daily.    Yes [provider]  terazosin (HYTRIN) 2 MG capsule Take 2 mg by mouth every evening.   Yes [provider]  vitamin B-12 (CYANOCOBALAMIN) 1000 MCG tablet Take 1,000 mcg by mouth every evening.   Yes [provider]  pantoprazole (PROTONIX) 20 MG  tablet Take 1 tablet (20 mg total) by mouth daily. 10/27/18   Charlesetta Shanks, MD    Family History Family History  Problem Relation Age of Onset  . CAD Mother   . CVA Mother   . Heart attack Father   . Heart attack Brother     Social History Social History   Tobacco Use  . Smoking status: Former Smoker  Substance Use Topics  . Alcohol use: No  . Drug use: No     Allergies   Patient has no known allergies.   Review of Systems Review of Systems 10 Systems reviewed and are negative for acute change except as noted in the HPI.   Physical Exam Updated Vital Signs BP (!) 167/96 (BP Location: Left Arm)   Pulse 96   Temp 98.2 F (36.8 C) (Oral)   Resp 18   SpO2 97%   Physical Exam  Constitutional:      Comments: Patient is alert in excellent condition for age.  No distress.  No respiratory distress.  HENT:     Head: Normocephalic and atraumatic.     Nose: Nose normal.     Mouth/Throat:     Comments: Posterior oropharynx is widely patent.  There is no erythema or exudate on the tonsillar pillars. Eyes:     Extraocular Movements: Extraocular movements intact.  Neck:     Comments: No palpable neck masses or asymmetries.  No significant lymphadenopathy.  No stridor.  His voice does have a slightly gravelly quality and sometimes sounds like there are extra secretions.  He clears these on his own. Cardiovascular:     Rate and Rhythm: Normal rate and regular rhythm.  Pulmonary:     Effort: Pulmonary effort is normal.     Breath sounds: Normal breath sounds.  Abdominal:     General: There is no distension.     Palpations: Abdomen is soft.     Tenderness: There is no abdominal tenderness. There is no guarding.  Musculoskeletal: Normal range of motion.        General: No swelling or tenderness.  Skin:    General: Skin is warm and dry.  Neurological:     General: No focal deficit present.     Mental Status: He is oriented to person, place, and time.     Coordination: Coordination normal.  Psychiatric:        Mood and Affect: Mood normal.      ED Treatments / Results  Labs (all labs ordered are listed, but only abnormal results are displayed) Labs Reviewed  NOVEL CORONAVIRUS, NAA (HOSPITAL ORDER, SEND-OUT TO REF LAB)  GROUP A STREP BY PCR    EKG None  Radiology Dg Chest 2 View  Result Date: 10/27/2018 CLINICAL DATA:  Congestion.  Cough EXAM: CHEST - 2 VIEW COMPARISON:  None FINDINGS: Mild cardiac enlargement and aortic atherosclerosis. Large hiatal hernia is identified. Scarring noted in the right lung base. No pleural effusion or edema. No airspace opacities. IMPRESSION: 1. No acute finding. 2. Large hiatal hernia 3.  Aortic Atherosclerosis (ICD10-I70.0).  Electronically Signed   By: Kerby Moors M.D.   On: 10/27/2018 16:41    Procedures Procedures (including critical care time)  Medications Ordered in ED Medications  pantoprazole (PROTONIX) EC tablet 20 mg (has no administration in time range)     Initial Impression / Assessment and Plan / ED Course  I have reviewed the triage vital signs and the nursing notes.  Pertinent labs & imaging results  that were available during my care of the patient were reviewed by me and considered in my medical decision making (see chart for details).  Clinical Course as of Oct 26 1717  Fri Oct 27, 2018  1537 Reviewed the patient's daughter.  She reports much of the symptoms are chronic.  No acute changes noted by her.  She reports his nurse at [facility] was very concerned about congestion of the chest.   [MP]    Clinical Course User Index [MP] Charlesetta Shanks, MD       Patient is clinically no distress.  X-ray does not show any focal pneumonia.  Review of systems and history present illness does not suggest pneumonia at this time.  Suspect patient has GERD or possibly a Zenker's diverticulum after reviewing his history with his daughter the symptoms he reported to me.  Suggest starting a PPI at this time.  We also discussed the pros and cons of either ENT or GI endoscopic exam.  She his daughter reports that he can discuss that with his treating physician.  At this time patient is stable for discharge.  Due to some degree of sore throat being a more pronounced symptom I did obtain COVID testing on send out basis but patient is not symptomatic in any terms except for some degree of pharyngitis.  Patient's daughter was updated on care plan and findings.  BHARGAV BARBARO was evaluated in Emergency Department on 10/27/2018 for the symptoms described in the history of present illness. He was evaluated in the context of the global COVID-19 pandemic, which necessitated consideration that the patient might be  at risk for infection with the SARS-CoV-2 virus that causes COVID-19. Institutional protocols and algorithms that pertain to the evaluation of patients at risk for COVID-19 are in a state of rapid change based on information released by regulatory bodies including the CDC and federal and state organizations. These policies and algorithms were followed during the patient's care in the ED.  Final Clinical Impressions(s) / ED Diagnoses   Final diagnoses:  Cough    ED Discharge Orders         Ordered    pantoprazole (PROTONIX) 20 MG tablet  Daily     10/27/18 1712           Charlesetta Shanks, MD 10/27/18 1721    Charlesetta Shanks, MD 10/27/18 1810

## 2018-10-27 NOTE — Discharge Instructions (Signed)
1.  Chest x-ray does not show signs of pneumonia and history does not suggest pneumonia at this time. 2.  You do have a large hiatal hernia.  Hiatal hernia and reflux can cause a lot of mucus and fluid to be in the throat.  I recommend starting daily Protonix.  It is advisable to follow-up with your family doctor regarding this management and ongoing treatment.  You might discuss a upper endoscopy to further evaluate this condition. 3.  A coronavirus test was done but will not be available for 24 to 48 hours.  Your symptoms at this time are not highly suggestive of coronavirus.  If you develop fever, shortness of breath, chest pain or increased coughing, contact your doctor immediately or return to the emergency department your symptoms are worsening.

## 2018-10-28 LAB — NOVEL CORONAVIRUS, NAA (HOSP ORDER, SEND-OUT TO REF LAB; TAT 18-24 HRS): SARS-CoV-2, NAA: NOT DETECTED

## 2018-11-22 DIAGNOSIS — R4189 Other symptoms and signs involving cognitive functions and awareness: Secondary | ICD-10-CM | POA: Diagnosis not present

## 2018-11-22 DIAGNOSIS — R54 Age-related physical debility: Secondary | ICD-10-CM | POA: Diagnosis not present

## 2018-11-22 DIAGNOSIS — R2689 Other abnormalities of gait and mobility: Secondary | ICD-10-CM | POA: Diagnosis not present

## 2018-11-23 DIAGNOSIS — R4189 Other symptoms and signs involving cognitive functions and awareness: Secondary | ICD-10-CM | POA: Diagnosis not present

## 2018-11-23 DIAGNOSIS — R2689 Other abnormalities of gait and mobility: Secondary | ICD-10-CM | POA: Diagnosis not present

## 2018-11-23 DIAGNOSIS — R54 Age-related physical debility: Secondary | ICD-10-CM | POA: Diagnosis not present

## 2018-11-30 DIAGNOSIS — R4189 Other symptoms and signs involving cognitive functions and awareness: Secondary | ICD-10-CM | POA: Diagnosis not present

## 2018-11-30 DIAGNOSIS — R2689 Other abnormalities of gait and mobility: Secondary | ICD-10-CM | POA: Diagnosis not present

## 2018-11-30 DIAGNOSIS — R54 Age-related physical debility: Secondary | ICD-10-CM | POA: Diagnosis not present

## 2018-12-05 DIAGNOSIS — R2689 Other abnormalities of gait and mobility: Secondary | ICD-10-CM | POA: Diagnosis not present

## 2018-12-05 DIAGNOSIS — R4189 Other symptoms and signs involving cognitive functions and awareness: Secondary | ICD-10-CM | POA: Diagnosis not present

## 2018-12-05 DIAGNOSIS — R54 Age-related physical debility: Secondary | ICD-10-CM | POA: Diagnosis not present

## 2018-12-07 DIAGNOSIS — R4189 Other symptoms and signs involving cognitive functions and awareness: Secondary | ICD-10-CM | POA: Diagnosis not present

## 2018-12-07 DIAGNOSIS — R54 Age-related physical debility: Secondary | ICD-10-CM | POA: Diagnosis not present

## 2018-12-07 DIAGNOSIS — R2689 Other abnormalities of gait and mobility: Secondary | ICD-10-CM | POA: Diagnosis not present

## 2018-12-22 DIAGNOSIS — R399 Unspecified symptoms and signs involving the genitourinary system: Secondary | ICD-10-CM | POA: Diagnosis not present

## 2019-01-24 DIAGNOSIS — Z03818 Encounter for observation for suspected exposure to other biological agents ruled out: Secondary | ICD-10-CM | POA: Diagnosis not present

## 2019-02-27 DIAGNOSIS — Z23 Encounter for immunization: Secondary | ICD-10-CM | POA: Diagnosis not present

## 2019-06-04 DIAGNOSIS — E039 Hypothyroidism, unspecified: Secondary | ICD-10-CM | POA: Diagnosis not present

## 2019-06-04 DIAGNOSIS — I10 Essential (primary) hypertension: Secondary | ICD-10-CM | POA: Diagnosis not present

## 2019-08-29 DIAGNOSIS — D485 Neoplasm of uncertain behavior of skin: Secondary | ICD-10-CM | POA: Diagnosis not present

## 2019-09-05 DIAGNOSIS — D485 Neoplasm of uncertain behavior of skin: Secondary | ICD-10-CM | POA: Diagnosis not present

## 2019-12-16 DEATH — deceased

## 2020-09-25 IMAGING — CT CT HEAD WITHOUT CONTRAST
3 series · 16 of 47 positions shown, 19 images · non-contrast
Comparison: 04/23/2013

CLINICAL DATA: Altered mental status

EXAM:
CT HEAD WITHOUT CONTRAST
TECHNIQUE: Contiguous axial images were obtained from the base of the skull
through the vertex without intravenous contrast.

[Series 2: head wo · axial · 0.47mm/px · z∈[-70,+55]mm · 10 of 31 slices shown, 13 images]
[im 3/31  brain]
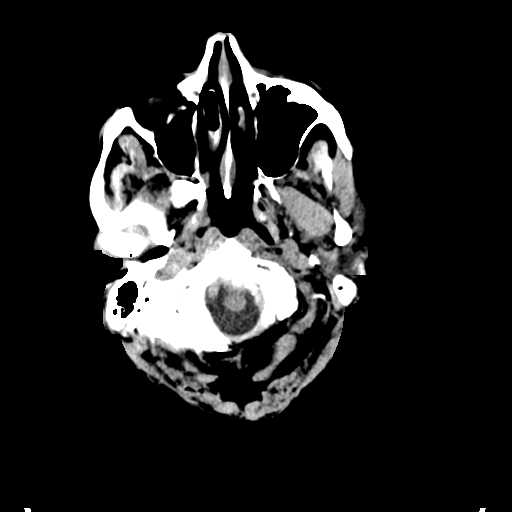
[im 3/31  bone]
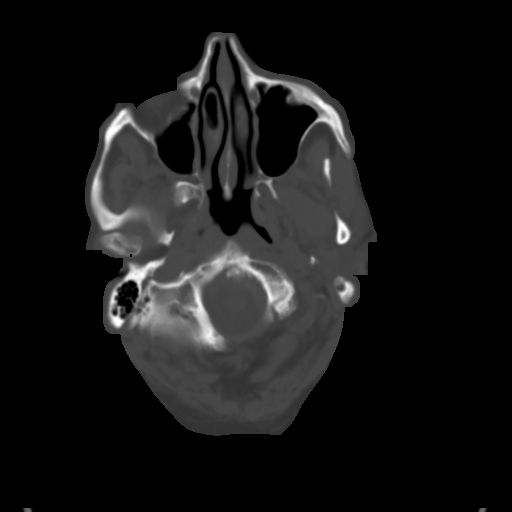
[im 6/31  brain]
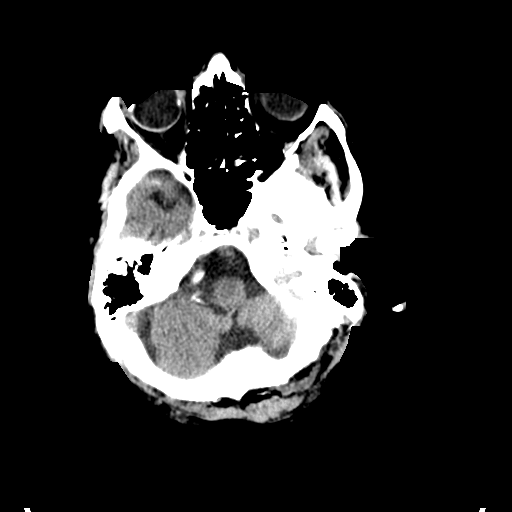
[im 9/31  brain]
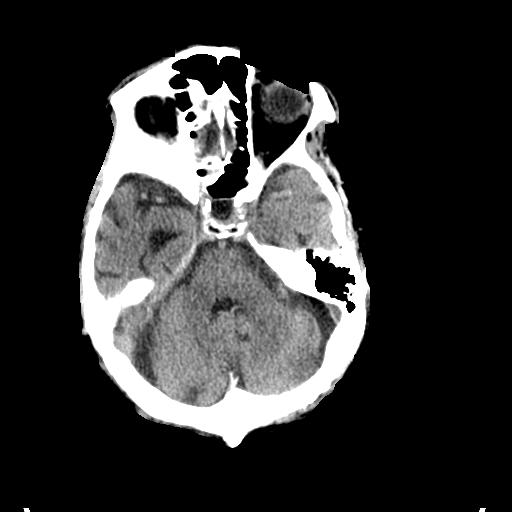
[im 11/31  brain]
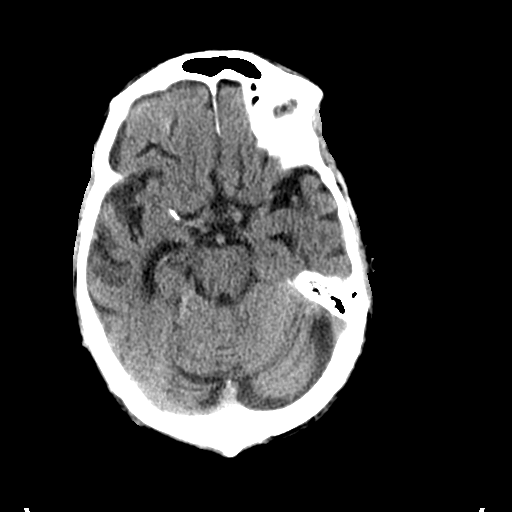
[im 14/31  brain]
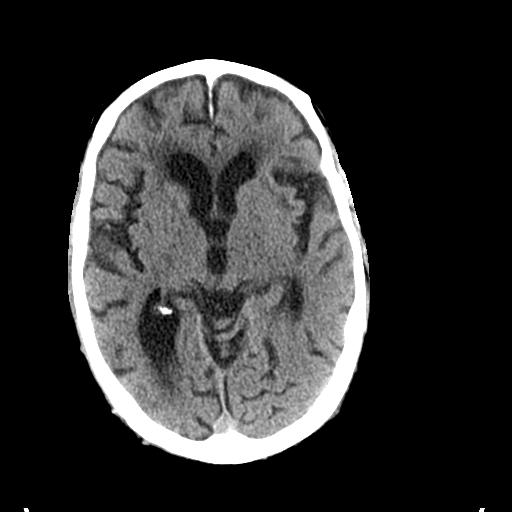
[im 14/31  bone]
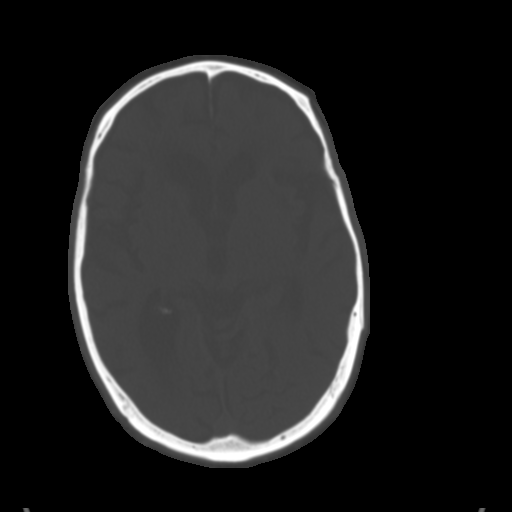
[im 17/31  brain]
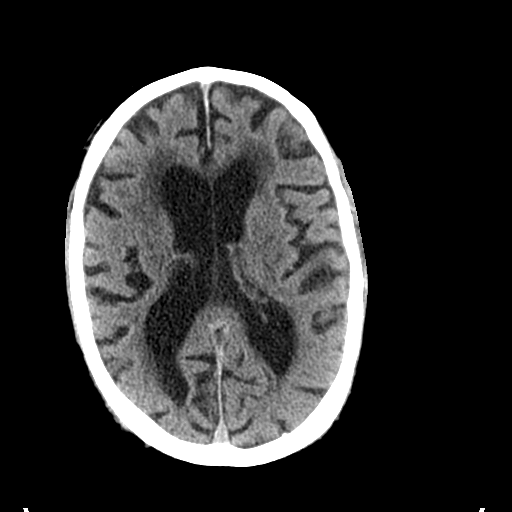
[im 20/31  brain]
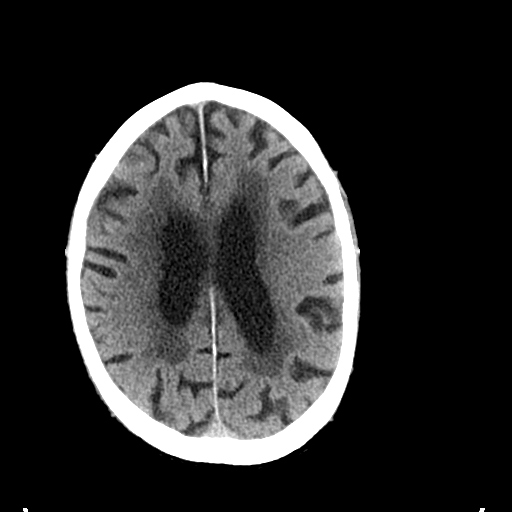
[im 23/31  brain]
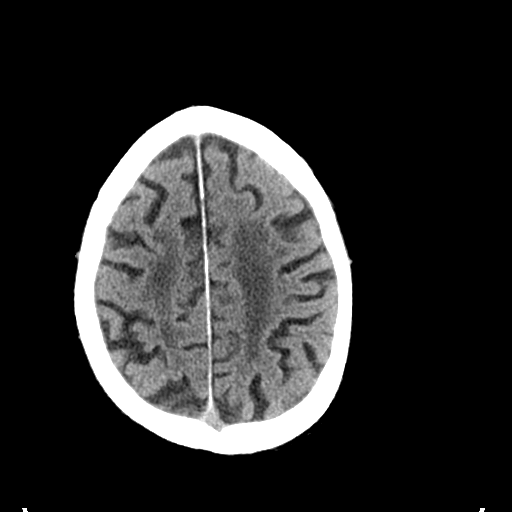
[im 25/31  brain]
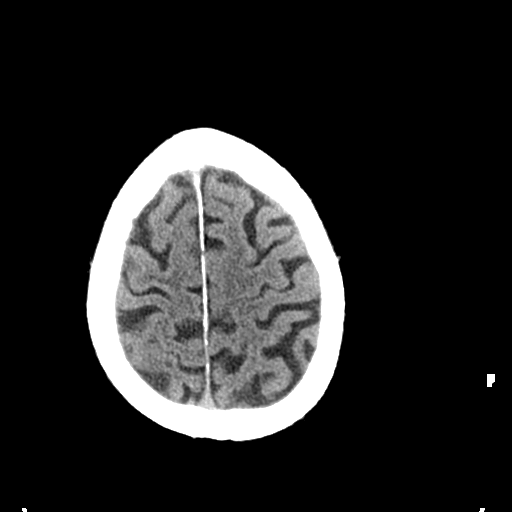
[im 25/31  bone]
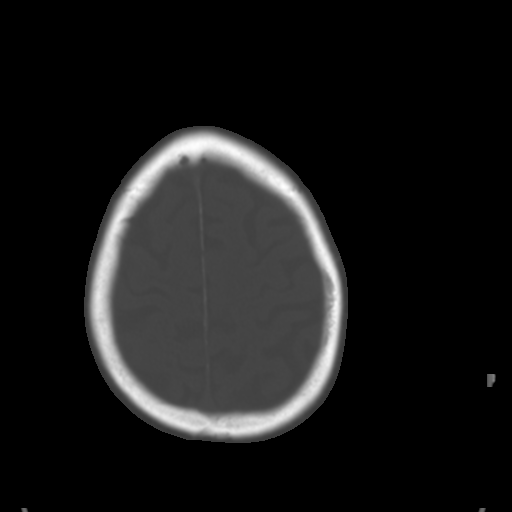
[im 28/31  brain]
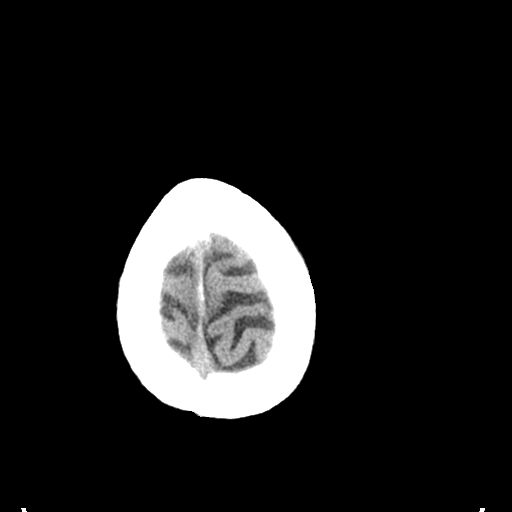

[Series 4: coronal soft tissue · coronal · 0.33mm/px · 3 of 72 slices shown]
[im 24/72  brain]
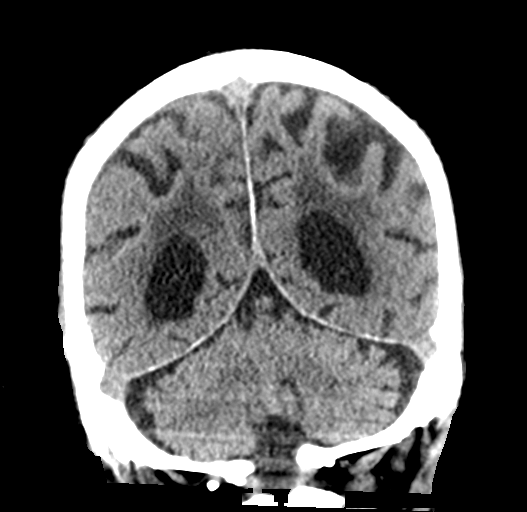
[im 32/72  brain]
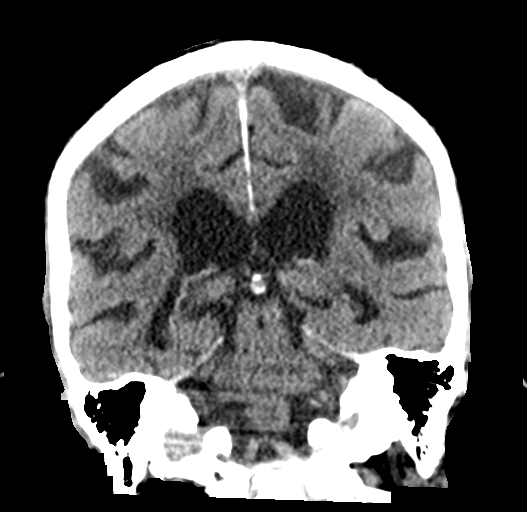
[im 40/72  brain]
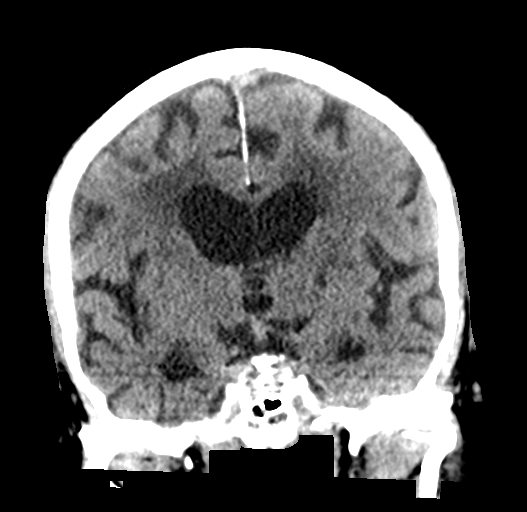

[Series 5: sagittal soft tissue · sagittal · 0.32mm/px · 3 of 58 slices shown]
[im 20/58  brain]
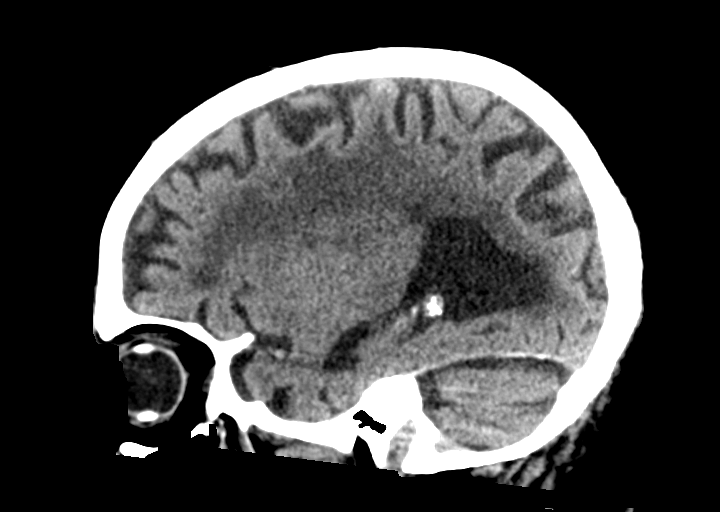
[im 29/58  brain]
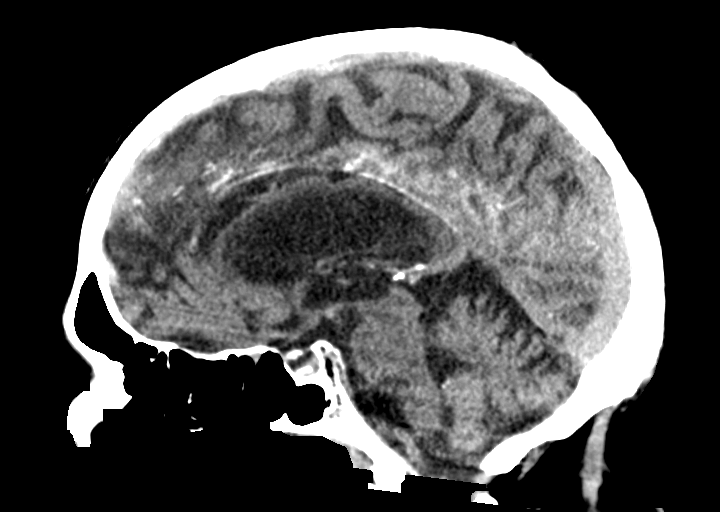
[im 39/58  brain]
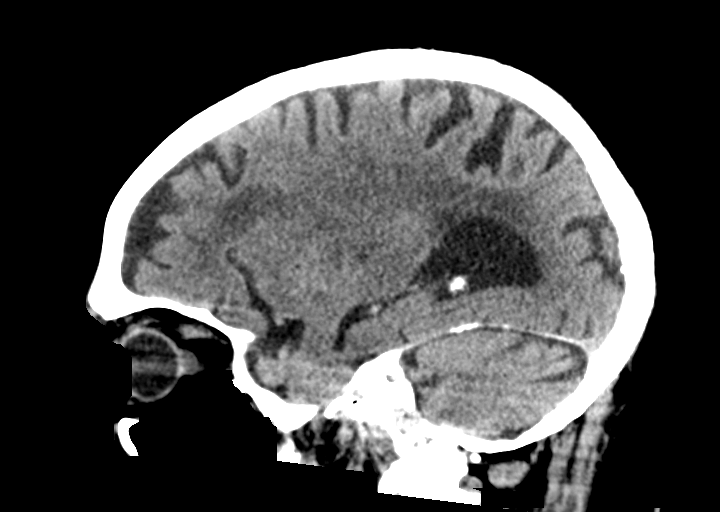

[16 of 47 positions shown; findings below may reference images not displayed]

FINDINGS: Brain: Chronic atrophic changes and white matter ischemic changes
are noted similar to that seen on the prior exam. No findings to
suggest acute hemorrhage, acute infarction or space-occupying mass
lesion are noted.

Vascular: No hyperdense vessel or unexpected calcification.

Skull: Normal. Negative for fracture or focal lesion.

Sinuses/Orbits: Postsurgical changes in the right globe are noted.

Other: None.
IMPRESSION: Chronic atrophic and ischemic changes without acute abnormality.

## 2020-10-26 IMAGING — CR CHEST - 2 VIEW
2 series · 2 of 2 positions shown · non-contrast
Comparison: None

CLINICAL DATA: Congestion.  Cough

EXAM:
CHEST - 2 VIEW

[w chest pa]
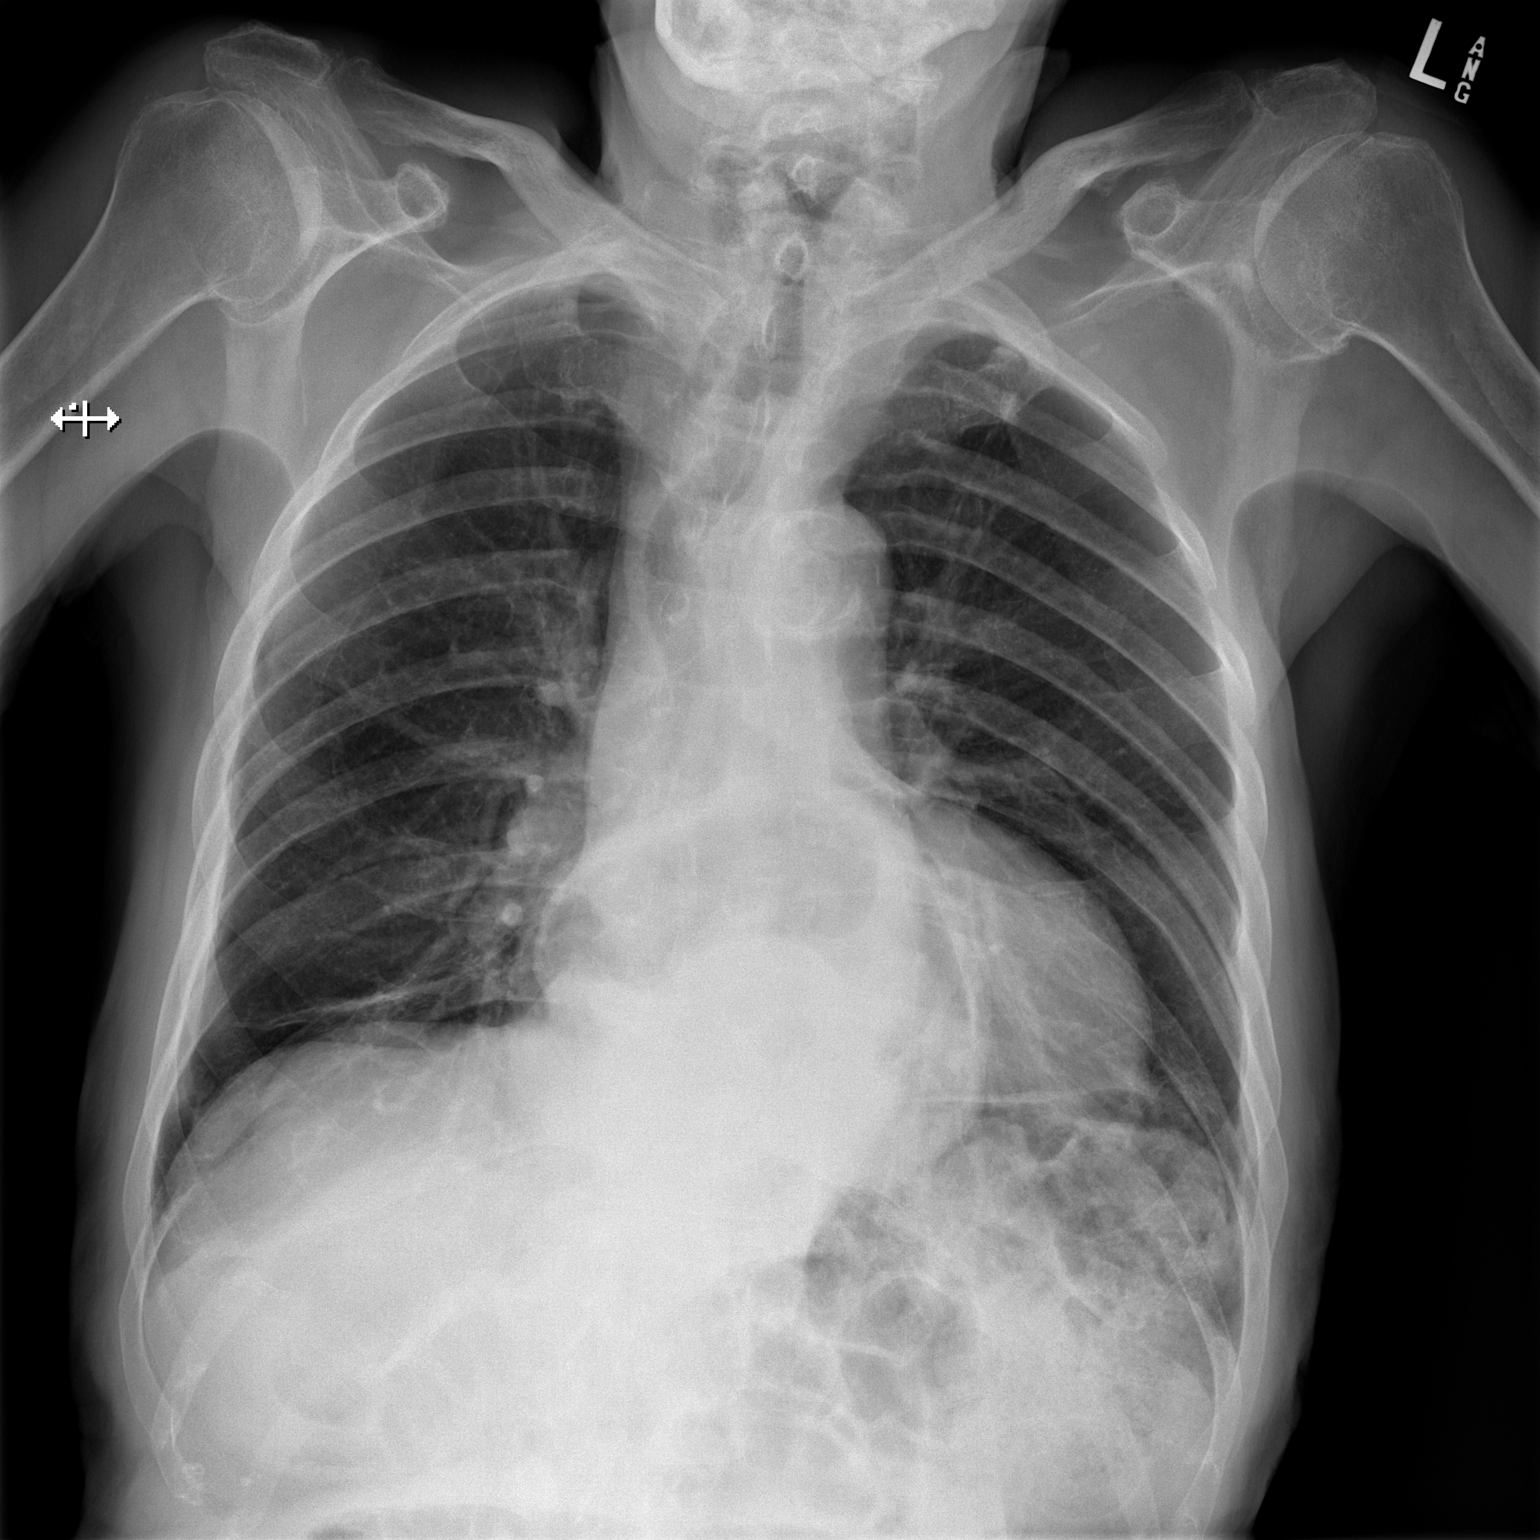

[w chest lat]
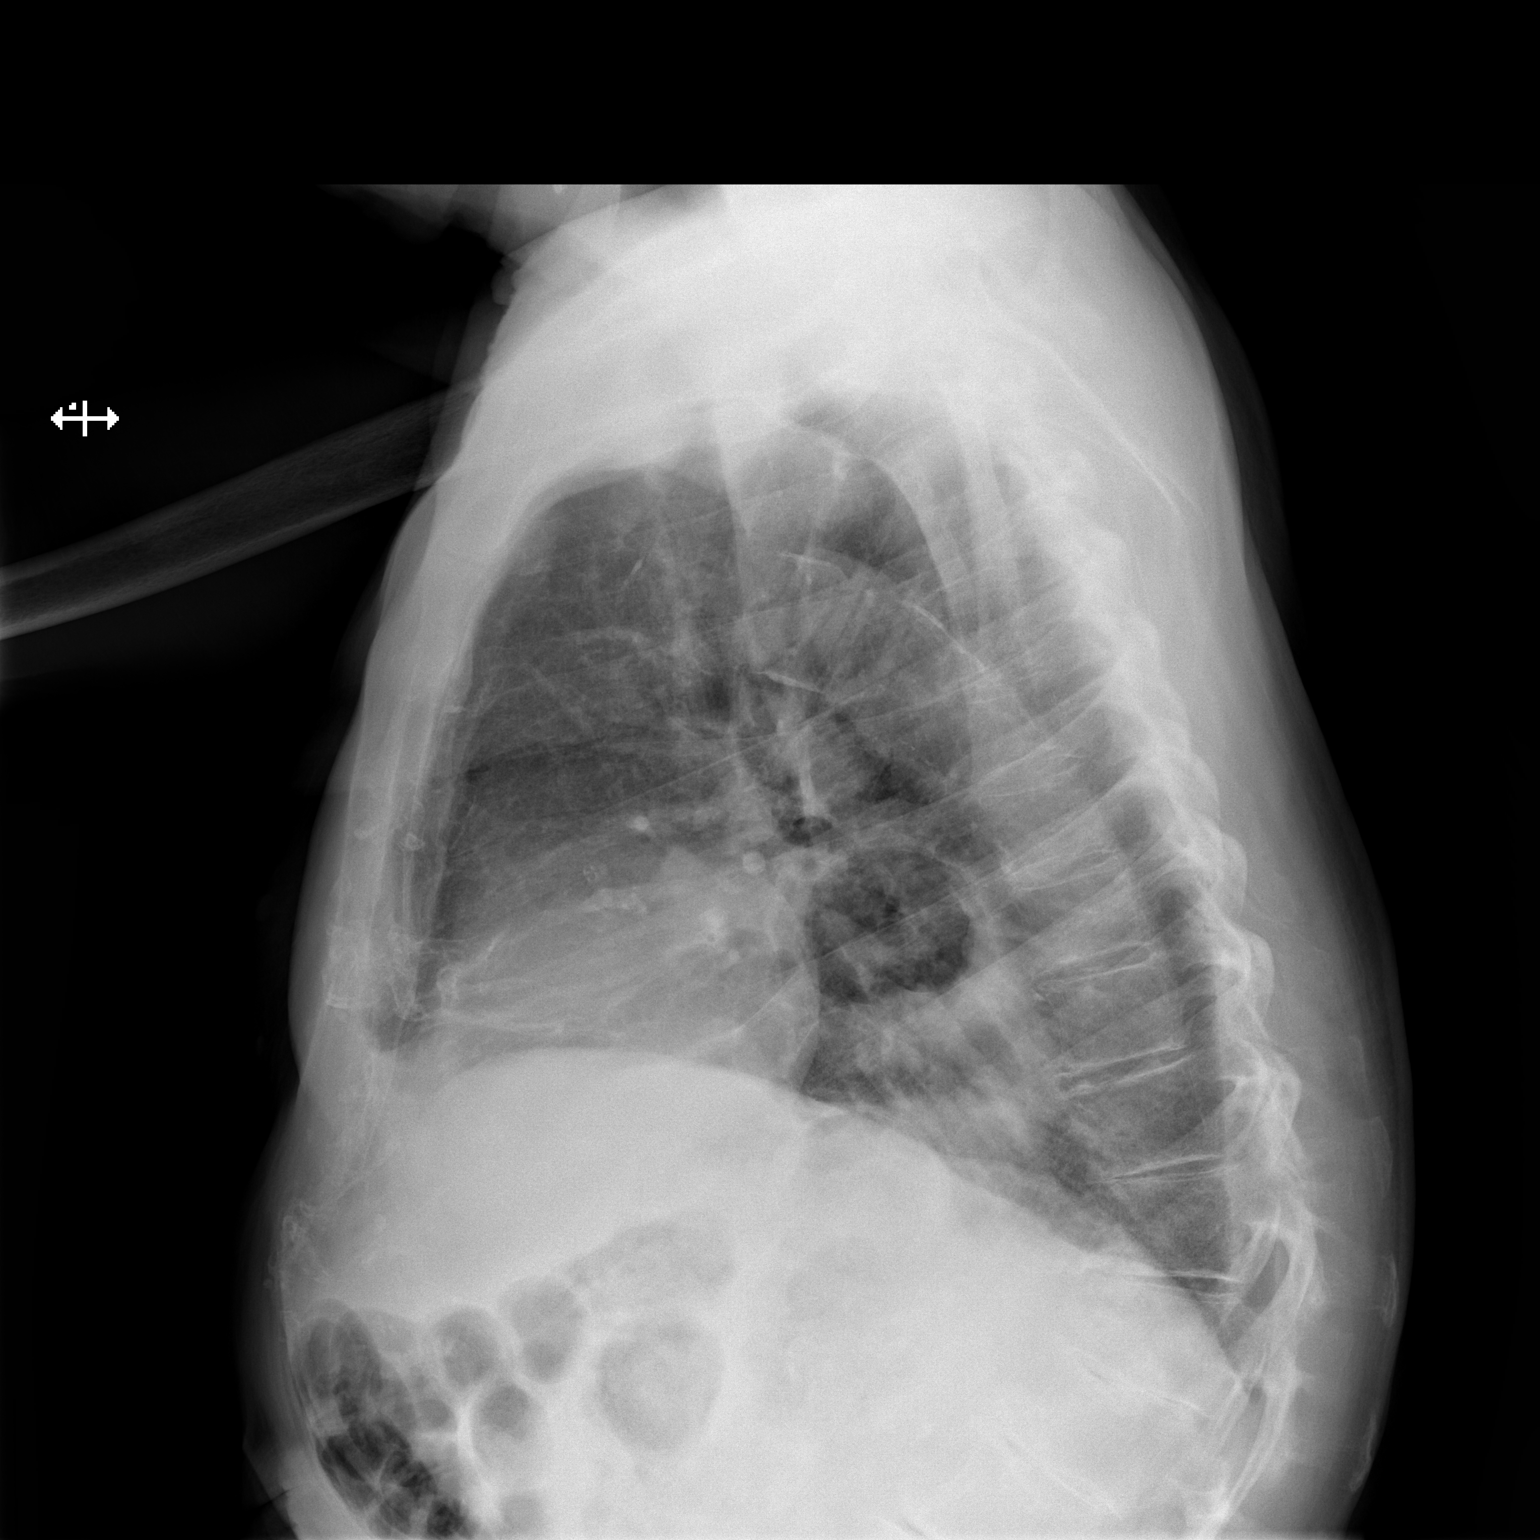

[2 of 2 positions shown; findings below may reference images not displayed]

FINDINGS: Mild cardiac enlargement and aortic atherosclerosis. Large hiatal
hernia is identified. Scarring noted in the right lung base. No
pleural effusion or edema. No airspace opacities.
IMPRESSION: 1. No acute finding.
2. Large hiatal hernia
3.  Aortic Atherosclerosis ([id number]).
